# Patient Record
Sex: Female | Born: 1970
Health system: Southern US, Community
[De-identification: ages and names within clinical notes are randomized; demographics above are authoritative.]

## PROBLEM LIST (undated history)

## (undated) DIAGNOSIS — N951 Menopausal and female climacteric states: Secondary | ICD-10-CM

## (undated) DIAGNOSIS — F329 Major depressive disorder, single episode, unspecified: Secondary | ICD-10-CM

## (undated) DIAGNOSIS — Z803 Family history of malignant neoplasm of breast: Secondary | ICD-10-CM

## (undated) DIAGNOSIS — Z1371 Encounter for nonprocreative screening for genetic disease carrier status: Secondary | ICD-10-CM

## (undated) DIAGNOSIS — F32A Depression, unspecified: Secondary | ICD-10-CM

## (undated) DIAGNOSIS — G4700 Insomnia, unspecified: Secondary | ICD-10-CM

## (undated) DIAGNOSIS — N920 Excessive and frequent menstruation with regular cycle: Secondary | ICD-10-CM

## (undated) HISTORY — DX: Insomnia, unspecified: G47.00

## (undated) HISTORY — DX: Excessive and frequent menstruation with regular cycle: N92.0

## (undated) HISTORY — DX: Encounter for nonprocreative screening for genetic disease carrier status: Z13.71

## (undated) HISTORY — PX: DILATION AND CURETTAGE OF UTERUS: SHX78

## (undated) HISTORY — DX: Depression, unspecified: F32.A

## (undated) HISTORY — DX: Menopausal and female climacteric states: N95.1

## (undated) HISTORY — PX: ENDOMETRIAL ABLATION: SHX621

## (undated) HISTORY — DX: Family history of malignant neoplasm of breast: Z80.3

## (undated) HISTORY — PX: TONSILLECTOMY: SUR1361

## (undated) HISTORY — DX: Major depressive disorder, single episode, unspecified: F32.9

---

## 2005-05-14 ENCOUNTER — Emergency Department: Payer: Self-pay | Admitting: Unknown Physician Specialty

## 2007-02-06 ENCOUNTER — Ambulatory Visit: Payer: Self-pay

## 2011-01-04 ENCOUNTER — Ambulatory Visit: Payer: Self-pay

## 2012-12-11 HISTORY — PX: ABLATION ON ENDOMETRIOSIS: SHX5787

## 2013-07-16 ENCOUNTER — Ambulatory Visit: Payer: Self-pay | Admitting: Obstetrics & Gynecology

## 2013-07-16 LAB — CBC
HCT: 34.2 % — ABNORMAL LOW (ref 35.0–47.0)
HGB: 12.3 g/dL (ref 12.0–16.0)
MCH: 30.6 pg (ref 26.0–34.0)
MCV: 85 fL (ref 80–100)
Platelet: 262 10*3/uL (ref 150–440)
RBC: 4 10*6/uL (ref 3.80–5.20)
RDW: 12.3 % (ref 11.5–14.5)
WBC: 6.3 10*3/uL (ref 3.6–11.0)

## 2013-07-17 ENCOUNTER — Ambulatory Visit: Payer: Self-pay | Admitting: Obstetrics & Gynecology

## 2013-10-11 HISTORY — PX: COLONOSCOPY: SHX174

## 2014-11-13 ENCOUNTER — Ambulatory Visit (INDEPENDENT_AMBULATORY_CARE_PROVIDER_SITE_OTHER): Payer: Managed Care, Other (non HMO)

## 2014-11-13 ENCOUNTER — Encounter: Payer: Self-pay | Admitting: Podiatry

## 2014-11-13 ENCOUNTER — Ambulatory Visit (INDEPENDENT_AMBULATORY_CARE_PROVIDER_SITE_OTHER): Payer: Managed Care, Other (non HMO) | Admitting: Podiatry

## 2014-11-13 VITALS — BP 136/86 | HR 74 | Resp 16 | Ht 62.0 in | Wt 132.0 lb

## 2014-11-13 DIAGNOSIS — M722 Plantar fascial fibromatosis: Secondary | ICD-10-CM

## 2014-11-13 MED ORDER — MELOXICAM 15 MG PO TABS: 15.0000 mg | ORAL_TABLET | Freq: Every day | ORAL | Status: DC

## 2014-11-13 MED ORDER — TRIAMCINOLONE ACETONIDE 10 MG/ML IJ SUSP
10.0000 mg | Freq: Once | INTRAMUSCULAR | Status: AC
  Administered 2014-11-13: 10 mg

## 2014-11-13 NOTE — Progress Notes (Signed)
   Subjective:    Patient ID: Lisa Mcbride, female    DOB: 03/06/1971, 43 y.o.   MRN: [id number]  HPI Comments: i have heel pain in my left foot and on the outside (lateral) this has been going on for over 1 year. Its remained the same. Walking and standing hurts. It hurts pretty much all the time. i seen dr troxler and he made me custom inserts and one injection. He did an ultra sound of my foot but no x-rays.  Foot Pain      Review of Systems  All other systems reviewed and are negative.      Objective:   Physical Exam        Assessment & Plan:

## 2014-11-13 NOTE — Patient Instructions (Signed)

## 2014-11-15 NOTE — Progress Notes (Signed)
Subjective:     Patient ID: Lisa Mcbride, female   DOB: Jan 19, 1971, 43 y.o.   MRN: [id number]  HPI patient states that she's had a one year history of heel pain left it's been treated by another physician with no results of the current time and she has difficulty walking and she has difficulty doing any forms of activity   Review of Systems  All other systems reviewed and are negative.      Objective:   Physical Exam  Constitutional: She is oriented to person, place, and time.  Cardiovascular: Intact distal pulses.   Musculoskeletal: Normal range of motion.  Neurological: She is oriented to person, place, and time.  Skin: Skin is warm.  Nursing note and vitals reviewed.  neurovascular status intact with muscle strength adequate and range of motion subtalar and midtarsal joint within normal limits. Patient is noted to have significant discomfort in the plantar left heel at the insertion of the tendon into the calcaneus with inflammation and fluid at the insertional point. Patient walks with a propulsive gait pattern due to pain and does have moderate depression of the arch upon weightbearing     Assessment:     Chronic plantar fasciitis left that has not spotted to conservative care    Plan:     H&P and x-rays reviewed. Today I did inject the plantar heel from the medial side which she has not had done 3 mg Kenalog 5 mg Xylocaine and dispensed air fracture walker with instructions on usage. Patient will be seen back for [place] to reevaluate again in the next several weeks and may require a more aggressive treatment course depending on the response

## 2014-11-20 ENCOUNTER — Ambulatory Visit (INDEPENDENT_AMBULATORY_CARE_PROVIDER_SITE_OTHER): Payer: Managed Care, Other (non HMO) | Admitting: Podiatry

## 2014-11-20 VITALS — BP 134/87 | HR 75 | Resp 16

## 2014-11-20 DIAGNOSIS — M722 Plantar fascial fibromatosis: Secondary | ICD-10-CM

## 2014-11-21 NOTE — Progress Notes (Signed)
Subjective:     Patient ID: Alto [place], female   DOB: September 19, 1971, 43 y.o.   MRN: [id number]  HPI patient states her heel continues to bother her some but it has improved but continues to bother her and she gets up in the morning and after periods of sitting   Review of Systems     Objective:   Physical Exam Neurovascular status intact with pain in the plantar heel left at the insertional point of the tendon into the calcaneus with fluid buildup noted upon evaluation and noted to have depression of the arch and mechanical dysfunction    Assessment:     Plantar fasciitis left still present was structural changes    Plan:     H&P and condition discussed. I went ahead today and recommended long-term night splint to try to stretch the plantar fascia properly with ice therapy and scanned for custom orthotics to try to reduce the mechanical dysfunction. Reappoint when orthotics are returned

## 2014-12-25 ENCOUNTER — Ambulatory Visit (INDEPENDENT_AMBULATORY_CARE_PROVIDER_SITE_OTHER): Payer: Commercial Managed Care - PPO | Admitting: *Deleted

## 2014-12-25 DIAGNOSIS — M722 Plantar fascial fibromatosis: Secondary | ICD-10-CM

## 2014-12-25 NOTE — Progress Notes (Signed)
Orthotics dispensed. Instructions given. Will see in 1 mo.  on progress.

## 2014-12-25 NOTE — Patient Instructions (Signed)

## 2015-01-26 ENCOUNTER — Ambulatory Visit (INDEPENDENT_AMBULATORY_CARE_PROVIDER_SITE_OTHER): Payer: Commercial Managed Care - PPO | Admitting: Podiatry

## 2015-01-26 DIAGNOSIS — M722 Plantar fascial fibromatosis: Secondary | ICD-10-CM

## 2015-01-26 NOTE — Progress Notes (Signed)
Orthotics functioning well. Still having some symptomotology.

## 2015-01-27 NOTE — Progress Notes (Signed)
Subjective:     Patient ID: Lisa Mcbride, female   DOB: 1970-12-13, 44 y.o.   MRN: [id number]  HPI patient states that my orthotics do well but I'm still having quite a bit of heel pain and also developing pain in my lower leg and my upper leg and I think it might be from walking differently   Review of Systems     Objective:   Physical Exam Neurovascular status intact with no changes in health history and continued severe discomfort in the plantar aspect of the left heel at the insertion of the tendon into the calcaneus. Most likely the pain she experiences in her leg is related to this due to obvious gait change upon evaluation    Assessment:     Approximate 1-1/2-2 year history of plantar fasciitis left that is probably causing change in gait and leading to other issues    Plan:     Reviewed this condition with her at great length at this point I have recommended that we need to take a more aggressive approach due to her long-standing nature of condition and pain. I discussed different treatment options and she discussed the possibility for amniotic injection which she can't get in the future. I do not recommend this treatment at this time but I do think shockwave would be her best solution currently I reviewed shockwave with her and she wants to pursue this option and she is scheduled for procedure understanding risk. I went ahead today and I did dispense air fracture walker with instructions on usage and she is scheduled 10 day to initiate shockwave therapy

## 2015-02-05 ENCOUNTER — Ambulatory Visit (INDEPENDENT_AMBULATORY_CARE_PROVIDER_SITE_OTHER): Payer: Commercial Managed Care - PPO | Admitting: Podiatry

## 2015-02-05 DIAGNOSIS — M722 Plantar fascial fibromatosis: Secondary | ICD-10-CM

## 2015-02-05 NOTE — Progress Notes (Signed)
Bar 4.0  freq 16.0    Pulses 2500

## 2015-02-06 NOTE — Progress Notes (Signed)
Subjective:     Patient ID: Lisa Mcbride, female   DOB: 22-Jun-1971, 44 y.o.   MRN: [id number]  HPI patient presents with heel pain left   Review of Systems     Objective:   Physical Exam Neurovascular status intact severe discomfort plantar left heel    Assessment:     Long-term acute plantar fasciitis left    Plan:     Shockwave administered 2500 shocks 16 frequency 4.0 on intensity

## 2015-02-11 ENCOUNTER — Encounter: Payer: Self-pay | Admitting: Podiatry

## 2015-02-11 ENCOUNTER — Ambulatory Visit (INDEPENDENT_AMBULATORY_CARE_PROVIDER_SITE_OTHER): Payer: Commercial Managed Care - PPO | Admitting: Podiatry

## 2015-02-11 VITALS — BP 122/90 | HR 75

## 2015-02-11 DIAGNOSIS — M722 Plantar fascial fibromatosis: Secondary | ICD-10-CM

## 2015-02-11 NOTE — Progress Notes (Signed)
Subjective:     Patient ID: Lisa Mcbride, female   DOB: 1971/08/11, 44 y.o.   MRN: [id number]  HPI patient presents for shockwave left stating that she still is having some pain   Review of Systems     Objective:   Physical Exam Neurovascular status intact with diminishment of pain but present left    Assessment:     Plantar fasciitis still present left    Plan:     Shockwave administered 2500 shocks 16 frequency 4.4 on intensity

## 2015-02-19 ENCOUNTER — Ambulatory Visit (INDEPENDENT_AMBULATORY_CARE_PROVIDER_SITE_OTHER): Payer: Commercial Managed Care - PPO | Admitting: Podiatry

## 2015-02-19 DIAGNOSIS — M722 Plantar fascial fibromatosis: Secondary | ICD-10-CM

## 2015-02-19 NOTE — Progress Notes (Signed)
Subjective:     Patient ID: Lisa Mcbride, female   DOB: 17-Aug-1971, 44 y.o.   MRN: [id number]  HPI patient presents stating she is improving with shockwave therapy   Review of Systems     Objective:   Physical Exam Neurovascular status intact diminished discomfort left plantar heel    Assessment:     Improving plantar fasciitis left    Plan:     2500 shocks administered at 5.0 intensity 16 frequency

## 2015-03-12 DIAGNOSIS — Z1371 Encounter for nonprocreative screening for genetic disease carrier status: Secondary | ICD-10-CM

## 2015-03-12 HISTORY — DX: Encounter for nonprocreative screening for genetic disease carrier status: Z13.71

## 2015-03-19 ENCOUNTER — Encounter: Payer: Self-pay | Admitting: Podiatry

## 2015-03-19 ENCOUNTER — Ambulatory Visit (INDEPENDENT_AMBULATORY_CARE_PROVIDER_SITE_OTHER): Payer: Commercial Managed Care - PPO | Admitting: Podiatry

## 2015-03-19 DIAGNOSIS — M722 Plantar fascial fibromatosis: Secondary | ICD-10-CM

## 2015-03-21 NOTE — Progress Notes (Signed)
Subjective:     Patient ID: Lisa Mcbride, female   DOB: 04-23-1971, 44 y.o.   MRN: [id number]  HPI patient states my heel was doing better but then it started to hurt again when I was excessively active   Review of Systems     Objective:   Physical Exam Neurovascular status intact a number moderate nature plantar heel left but nowhere near as bad as when we started with treatment    Assessment:     Plantar fasciitis improving left but present    Plan:     Shockwave administered 2500 shocks 16 frequency 5.0 intensity and will be seen back in several months if symptoms were to persist

## 2015-04-02 NOTE — Op Note (Signed)
PATIENT NAME:  Lisa Mcbride, KRIKORIAN MR#:  [id number] DATE OF BIRTH:  02-11-1971  DATE OF PROCEDURE:  07/17/2013  PREOPERATIVE DIAGNOSIS:  Menorrhagia.   POSTOPERATIVE DIAGNOSIS:  Menorrhagia.   PROCEDURE PERFORMED:  Hysteroscopy, dilation and curettage procedure with endometrial ablation using NovaSure.   SURGEON:  Barnett Applebaum, M.D.   ANESTHESIA:  General.   ESTIMATED BLOOD LOSS:  Minimal.   COMPLICATIONS:  None.   FINDINGS:  The patient had a proliferative endometrial lining with no evidence for polyps or fibroids.  Measurements for the NovaSure device included a cavity length of 5.5 cm, a cavity width of 3.5 cm, a power of 106 and a time of 70 seconds.   DISPOSITION:  To the recovery room in stable condition.   TECHNIQUE:  The patient is prepped and draped in the usual sterile fashion after adequate anesthesia is obtained in the dorsal lithotomy position.  Bladder is drained using a Robinson catheter.  The cervix is identified and after speculum is placed and the anterior lip is grasped with a tenaculum, the cervix is carefully dilated to a size 16 Pratt dilator.  The hysteroscope was then inserted with saline distention of the intrauterine cavity and the above-mentioned findings are visualized.  Instrument is removed and a gentle curettage is performed using a banjo curette.  Specimen sent to pathology.  Using the NovaSure endometrial ablation device with above-mentioned measurements, it is placed without complication.  It is tested and determined to be safely placed.  The procedure is performed and once complete the instrument is removed.  Repeat hysteroscopy reveals a blanching of the lining of the uterus.  There are no evidence for perforations or bleeding.  Instrument is removed.  There is a minimal discrepancy of saline fluid at the end of the case.  The patient tolerates the procedure well.  All sponge, instrument and needle counts are correct.    ____________________________ R. Barnett Applebaum, MD rph:ea D: 07/17/2013 16:53:06 ET T: 07/18/2013 02:57:15 ET JOB#: [id number]  cc: Glean Salen, MD, <Dictator> Gae Dry MD ELECTRONICALLY SIGNED 07/22/2013 8:20

## 2015-12-08 ENCOUNTER — Ambulatory Visit: Payer: Commercial Managed Care - PPO | Admitting: Podiatry

## 2015-12-14 ENCOUNTER — Ambulatory Visit (INDEPENDENT_AMBULATORY_CARE_PROVIDER_SITE_OTHER): Payer: 59

## 2015-12-14 ENCOUNTER — Encounter: Payer: Self-pay | Admitting: Sports Medicine

## 2015-12-14 ENCOUNTER — Ambulatory Visit (INDEPENDENT_AMBULATORY_CARE_PROVIDER_SITE_OTHER): Payer: 59 | Admitting: Sports Medicine

## 2015-12-14 DIAGNOSIS — M21621 Bunionette of right foot: Secondary | ICD-10-CM

## 2015-12-14 DIAGNOSIS — M79671 Pain in right foot: Secondary | ICD-10-CM

## 2015-12-14 DIAGNOSIS — M79672 Pain in left foot: Secondary | ICD-10-CM | POA: Diagnosis not present

## 2015-12-14 DIAGNOSIS — M722 Plantar fascial fibromatosis: Secondary | ICD-10-CM | POA: Diagnosis not present

## 2015-12-14 MED ORDER — METHYLPREDNISOLONE 4 MG PO TBPK: ORAL_TABLET | ORAL | Status: DC

## 2015-12-14 MED ORDER — DICLOFENAC SODIUM 75 MG PO TBEC: 75.0000 mg | DELAYED_RELEASE_TABLET | Freq: Two times a day (BID) | ORAL | Status: DC

## 2015-12-14 NOTE — Patient Instructions (Signed)

## 2015-12-14 NOTE — Progress Notes (Signed)
Patient ID: Lisa Mcbride, female   DOB: 12/17/1970, 45 y.o.   MRN: [id number] Subjective: Lisa Mcbride is a 45 y.o. female patient presents to office with complaint of heel pain bilateral. Patient admits to post static dyskinesia for 3 months in right and for 2 years in the left. Was treated in the past with 2 injections and shockwave on the left; Patient also went to chiropractor and had active release procedure which seemed to helped but insurance stopped pain for it. Patient states that she has also tried stretching, advil, and change of shoes and has been running 63mi/wk with no difference. Denies any other pedal complaints.  There are no active problems to display for this patient.  Current Outpatient Prescriptions on File Prior to Visit  Medication Sig Dispense Refill  . Calcium-Magnesium-Zinc 333-133-5 MG TABS Take by mouth.    . Cholecalciferol (VITAMIN D3) 1000 UNITS CAPS Take by mouth.     No current facility-administered medications on file prior to visit.   Allergies  Allergen Reactions  . Codeine Nausea And Vomiting    Also nausea.     Objective: Physical Exam General: The patient is alert and oriented x3 in no acute distress.  Dermatology: Skin is warm, dry and supple bilateral lower extremities. Nails 1-10 are normal. There is no erythema, edema, no eccymosis, no open lesions present. Integument is otherwise unremarkable.  Vascular: Dorsalis Pedis pulse and Posterior Tibial pulse are 2/4 bilateral. Capillary fill time is immediate to all digits.  Neurological: Grossly intact to light touch with an achilles reflex of +2/5 and a  negative Tinel's sign bilateral.  Musculoskeletal: Tenderness to palpation at the medial calcaneal tubercale and through the insertion of the plantar fascia bilateral feet. Mild tenderness to right 5th MTPJ with mild edema likely early capsulitis and tailors bunion deformity. No pain with compression of calcaneus bilateral. No pain with  tuning fork to calcaneus bilateral. No pain with calf compression bilateral. There is mild decreased Ankle joint range of motion bilateral. All other joints range of motion within normal limits bilateral. Strength 5/5 in all groups bilateral.    Xray, Right foot: 3 Views Normal osseous mineralization. Joint spaces preserved. Mild tailors bunion deformity. No fracture/dislocation/boney destruction. Osteophtye at TN joint and Calcaneal spur present with mild thickening of plantar fascia. No other soft tissue abnormalities or radiopaque foreign bodies.   Assessment and Plan: Problem List Items Addressed This Visit    None    Visit Diagnoses    Right foot pain    -  Primary    Relevant Medications    diclofenac (VOLTAREN) 75 MG EC tablet    Other Relevant Orders    DG Foot 2 Views Right    Left foot pain        Relevant Medications    diclofenac (VOLTAREN) 75 MG EC tablet    Plantar fasciitis, bilateral        Relevant Medications    methylPREDNISolone (MEDROL DOSEPAK) 4 MG TBPK tablet    diclofenac (VOLTAREN) 75 MG EC tablet    Tailor's bunion of right foot        Relevant Medications    diclofenac (VOLTAREN) 75 MG EC tablet       -Complete examination performed. Discussed with patient in detail the condition of taliors bunion and plantar fasciitis, how this occurs and general treatment options. Explained both conservative and surgical treatments.  -Patient declined injection today and reports that she has a friend that is a  medical rep would like to contact him to donate stim cell product for injection; Discussed with patient that if she would have the rep to call the office for [place] to arrange appropriate process of getting the stim cell product that I would inject both heels for the patient -Rx Diclofenac and Medrol dose pack to start after dose pack is completed -Recommended good supportive shoes and advised use of OTC insert.  - Explained in detail the use of the fascial brace &  night splint. A new fascial brace was dispensed for the right foot at today's visit. Patient to go back to using fascial brace she already owns for the left and night splint daily of which she owns daily alternating as instructed. -Explained and dispensed to patient daily stretching exercises. -Recommend cut back on running to 32mi/wk until symptoms improve. -Recommend patient to ice affected area 1-2x daily. -Patient to return to office in 3 weeks for possible stim cell injection or sooner if problems or questions arise.  Landis Martins, DPM

## 2016-01-05 ENCOUNTER — Other Ambulatory Visit: Payer: Self-pay | Admitting: Sports Medicine

## 2016-01-07 ENCOUNTER — Ambulatory Visit: Payer: Commercial Managed Care - PPO | Admitting: Sports Medicine

## 2016-01-11 ENCOUNTER — Ambulatory Visit (INDEPENDENT_AMBULATORY_CARE_PROVIDER_SITE_OTHER): Payer: 59 | Admitting: Sports Medicine

## 2016-01-11 ENCOUNTER — Encounter: Payer: Self-pay | Admitting: Sports Medicine

## 2016-01-11 DIAGNOSIS — M21621 Bunionette of right foot: Secondary | ICD-10-CM

## 2016-01-11 DIAGNOSIS — M722 Plantar fascial fibromatosis: Secondary | ICD-10-CM

## 2016-01-11 DIAGNOSIS — M79671 Pain in right foot: Secondary | ICD-10-CM

## 2016-01-11 DIAGNOSIS — M79672 Pain in left foot: Secondary | ICD-10-CM

## 2016-01-11 MED ORDER — METHYLPREDNISOLONE 4 MG PO TBPK: ORAL_TABLET | ORAL | Status: DC

## 2016-01-11 NOTE — Progress Notes (Signed)
Patient ID: EIZABETH ZACARIAS, female   DOB: [birth year], 45 y.o.   MRN: [id number]  Subjective: SOVANNA LUQUETTE is a 45 y.o. female patient retuns to office for follow up eval of heel pain bilateral. Patient states that the steroid medication and the anti-inflammatory really helped; the steroid really helped her feet and all her joints feel much better but since completing it the pain is starting to come back in both heels; right is sharp and left is dull achy worse after a period of sitting to standing position. Denies any other pedal complaints.  There are no active problems to display for this patient.  Current Outpatient Prescriptions on File Prior to Visit  Medication Sig Dispense Refill  . Calcium-Magnesium-Zinc 333-133-5 MG TABS Take by mouth.    . Cholecalciferol (VITAMIN D3) 1000 UNITS CAPS Take by mouth.    . citalopram (CELEXA) 20 MG tablet Take by mouth.    . diazepam (VALIUM) 5 MG tablet   0  . diclofenac (VOLTAREN) 75 MG EC tablet Take 1 tablet (75 mg total) by mouth 2 (two) times daily. 30 tablet 0   No current facility-administered medications on file prior to visit.   Allergies  Allergen Reactions  . Codeine Nausea And Vomiting    Also nausea.     Objective: Physical Exam General: The patient is alert and oriented x3 in no acute distress.  Dermatology: Skin is warm, dry and supple bilateral lower extremities. Nails 1-10 are normal. There is no erythema, edema, no eccymosis, no open lesions present. Integument is otherwise unremarkable.  Vascular: Dorsalis Pedis pulse and Posterior Tibial pulse are 2/4 bilateral. Capillary fill time is immediate to all digits.  Neurological: Grossly intact to light touch with an achilles reflex of +2/5 and a  negative Tinel's sign bilateral.  Musculoskeletal: Tenderness to palpation at the medial calcaneal tubercale and through the insertion of the plantar fascia bilateral feet, R>L on today's exam. No tenderness to right 5th MTPJ  with decreased edema likely early capsulitis and tailors bunion deformity. No pain with compression of calcaneus bilateral. No pain with tuning fork to calcaneus bilateral. No pain with calf compression bilateral. There is mild decreased Ankle joint range of motion bilateral. All other joints range of motion within normal limits bilateral. Strength 5/5 in all groups bilateral.    Assessment and Plan: Problem List Items Addressed This Visit    None    Visit Diagnoses    Plantar fasciitis, bilateral    -  Primary    Relevant Medications    methylPREDNISolone (MEDROL DOSEPAK) 4 MG TBPK tablet    Tailor's bunion of right foot        Right foot pain        Left foot pain           -Complete examination performed. Discussed with patient in detail the condition of taliors bunion and plantar fasciitis, how this occurs and general treatment options. Explained both conservative and surgical treatments.  -Awaiting medical rep to contact me to donate stim cell product for injection; Discussed with patient that if she would have the rep to call the office for [place] to arrange appropriate process of getting the stim cell product that I would inject both heels for the patient -Rx refilled Medrol dose pack for the meantime to help with symptoms -Recommended good supportive shoes and advised use of OTC insert.  - Cont with fascial brace & night splint daily. -Cont daily stretching exercises. -Recommend cont with  cutting back on running to 43mi/wk until symptoms improve. -Recommend patient to ice affected area 1-2x daily. -Patient to return to office in 2 weeks for possible stim cell injection or sooner if problems or questions arise.  Landis Martins, DPM

## 2016-01-28 ENCOUNTER — Ambulatory Visit: Payer: 59 | Admitting: Sports Medicine

## 2016-02-04 ENCOUNTER — Ambulatory Visit: Payer: 59 | Admitting: Sports Medicine

## 2016-02-24 ENCOUNTER — Telehealth: Payer: Self-pay | Admitting: *Deleted

## 2016-02-24 NOTE — Telephone Encounter (Addendum)
-----   Message from Landis Martins, [place] sent at 02/23/2016  7:47 PM EDT ----- Regarding: Flo-Graft Injection  Hi Roran Wegner,  Can you call Patient to see if she is still having fasciitis pain and symptoms?  If so can you let her know I finally talked with the medical rep from [facility] for the injection to try on her feet and get her scheduled for an appointment. Once her appointment has been set, we will need to contact Richardson Landry from [facility] to come the day of her appointment with the Flo-graft amniotic injectable.  Thanks Dr. Cannon Kettle  02/23/2106- Unable to contact pt to check plantar fasciitis status, contact phone rang for over 1 minute without answering service.  02/28/2016-Informed Dr. Cannon Kettle I had not been able to contact pt and she said she would also try to contact pt.

## 2016-03-01 ENCOUNTER — Telehealth: Payer: Self-pay | Admitting: Sports Medicine

## 2016-03-01 NOTE — Telephone Encounter (Signed)
Left voice message with call back phone # in reference to patient coming to office for injection of Flo-graft amniotic. Patient did not answered. Advised patient over the voicemail that if she is still having fasciitis symptoms to call the office to schedule an appointment to come in for the injection, The injection is to be supplied compliments of [facility].  -Dr. Cannon Kettle

## 2016-03-02 ENCOUNTER — Ambulatory Visit (INDEPENDENT_AMBULATORY_CARE_PROVIDER_SITE_OTHER): Payer: 59 | Admitting: Sports Medicine

## 2016-03-02 ENCOUNTER — Encounter: Payer: Self-pay | Admitting: Sports Medicine

## 2016-03-02 DIAGNOSIS — M21621 Bunionette of right foot: Secondary | ICD-10-CM | POA: Diagnosis not present

## 2016-03-02 DIAGNOSIS — M79672 Pain in left foot: Secondary | ICD-10-CM

## 2016-03-02 DIAGNOSIS — M79671 Pain in right foot: Secondary | ICD-10-CM | POA: Diagnosis not present

## 2016-03-02 DIAGNOSIS — M722 Plantar fascial fibromatosis: Secondary | ICD-10-CM

## 2016-03-02 NOTE — Progress Notes (Signed)
Patient ID: Lisa Mcbride, female   DOB: 08-01-1971, 45 y.o.   MRN: [id number] Subjective: Lisa Mcbride is a 45 y.o. female patient retuns to office for follow up eval of heel pain bilateral. Patient still has right greater than left heel pain that is dull achy worse after a period of sitting to standing position. Patient is desiring special amniotic injection for plantar fasciitis as supplied by [employer] complimentary to patient. Admits to occasional pain over the right fifth metatarsophalangeal joint tailors bunion area from compensation from chronic pain in heels. Denies any other pedal complaints.  There are no active problems to display for this patient.  Current Outpatient Prescriptions on File Prior to Visit  Medication Sig Dispense Refill  . Calcium-Magnesium-Zinc 333-133-5 MG TABS Take by mouth.    . Cholecalciferol (VITAMIN D3) 1000 UNITS CAPS Take by mouth.    . citalopram (CELEXA) 20 MG tablet Take by mouth.    . diazepam (VALIUM) 5 MG tablet   0  . diclofenac (VOLTAREN) 75 MG EC tablet Take 1 tablet (75 mg total) by mouth 2 (two) times daily. 30 tablet 0  . methylPREDNISolone (MEDROL DOSEPAK) 4 MG TBPK tablet Take as instructed 21 tablet 0   No current facility-administered medications on file prior to visit.   Allergies  Allergen Reactions  . Codeine Nausea And Vomiting    Also nausea.     Objective: Physical Exam General: The patient is alert and oriented x3 in no acute distress.  Dermatology: Skin is warm, dry and supple bilateral lower extremities. Nails 1-10 are normal. There is no erythema, edema, no eccymosis, no open lesions present. Integument is otherwise unremarkable.  Vascular: Dorsalis Pedis pulse and Posterior Tibial pulse are 2/4 bilateral. Capillary fill time is immediate to all digits.  Neurological: Grossly intact to light touch with an achilles reflex of +2/5 and a  negative Tinel's sign bilateral.  Musculoskeletal: Tenderness to  palpation at the medial calcaneal tubercale and through the insertion of the plantar fascia bilateral feet, R>L. No tenderness to right 5th MTPJ with decreased edema likely early capsulitis and tailors bunion deformity, secondary to overload compensation due to heel pain. No pain with tuning fork to calcaneus bilateral. No pain with calf compression bilateral. There is mild decreased Ankle joint range of motion bilateral. All other joints range of motion within normal limits bilateral. Strength 5/5 in all groups bilateral.    Assessment and Plan: Problem List Items Addressed This Visit    None    Visit Diagnoses    Plantar fasciitis, bilateral    -  Primary    Tailor's bunion of right foot        Right foot pain        Left foot pain          -Complete examination performed. Discussed with patient in detail the condition of taliors bunion and plantar fasciitis, how this occurs and general treatment options. Explained both conservative and surgical treatments.  -After aseptic prep, injected to right and left heels at medial insertion of plantar fascia 1 mL mixture of lidocaine mixed with 1 mL [employer] flo-graft freedom extra-large, product code number [id number], identifer number [id number], expiration date 10/08/2019. None wasted. This injectable Biologics was supplied by the company at no charge. Patient tolerated injection well with no acute complication. Applied offloading arch pad and plantar fascial compression wraps bilateral to protect fascia after injection; patient to keep intact for 1 day. Advised patient to refrain  from running or extensive activities for 1 week. Patient to refrain from using anti-inflammatories NSAIDs or ice. At this time to allow the stem cell properties of this injectable to naturally work. -Recommended good supportive shoes and advised use of OTC insert -Cont with fascial brace & night splint daily -Cont daily stretching exercises -Patient to return to  office in 3 weeks for recheck of injection sites or sooner if problems or questions arise.  Landis Martins, DPM

## 2016-03-03 ENCOUNTER — Ambulatory Visit: Payer: 59 | Admitting: Sports Medicine

## 2016-03-31 ENCOUNTER — Ambulatory Visit: Payer: 59 | Admitting: Sports Medicine

## 2016-04-21 ENCOUNTER — Ambulatory Visit: Payer: 59 | Admitting: Sports Medicine

## 2016-05-12 ENCOUNTER — Encounter: Payer: Self-pay | Admitting: Sports Medicine

## 2016-05-12 ENCOUNTER — Ambulatory Visit (INDEPENDENT_AMBULATORY_CARE_PROVIDER_SITE_OTHER): Payer: 59 | Admitting: Sports Medicine

## 2016-05-12 DIAGNOSIS — M722 Plantar fascial fibromatosis: Secondary | ICD-10-CM

## 2016-05-12 DIAGNOSIS — M79672 Pain in left foot: Secondary | ICD-10-CM

## 2016-05-12 DIAGNOSIS — M79671 Pain in right foot: Secondary | ICD-10-CM

## 2016-05-12 NOTE — Progress Notes (Signed)
Patient ID: Lisa Mcbride, female   DOB: 06/02/1971, 44 y.o.   MRN: [id number] Subjective: Lisa Mcbride is a 45 y.o. female patient retuns to office for follow up eval of heel pain bilateral, S/p Flograft amniotic injection on 03-02-16. Patient states it helped for 2 days and she still has right greater than left heel pain that is dull achy worse after a period of sitting to standing position. Admits to resolved pain over the right fifth metatarsophalangeal joint tailors bunion area. Denies any other pedal complaints.  There are no active problems to display for this patient.  Current Outpatient Prescriptions on File Prior to Visit  Medication Sig Dispense Refill  . Calcium-Magnesium-Zinc 333-133-5 MG TABS Take by mouth.    . Cholecalciferol (VITAMIN D3) 1000 UNITS CAPS Take by mouth.    . citalopram (CELEXA) 20 MG tablet Take by mouth.    . diazepam (VALIUM) 5 MG tablet   0  . diclofenac (VOLTAREN) 75 MG EC tablet Take 1 tablet (75 mg total) by mouth 2 (two) times daily. 30 tablet 0  . methylPREDNISolone (MEDROL DOSEPAK) 4 MG TBPK tablet Take as instructed 21 tablet 0   No current facility-administered medications on file prior to visit.   Allergies  Allergen Reactions  . Codeine Nausea And Vomiting    Also nausea.     Objective: Physical Exam General: The patient is alert and oriented x3 in no acute distress.  Dermatology: Skin is warm, dry and supple bilateral lower extremities. Nails 1-10 are normal. There is no erythema, edema, no eccymosis, no open lesions present. Integument is otherwise unremarkable.  Vascular: Dorsalis Pedis pulse and Posterior Tibial pulse are 2/4 bilateral. Capillary fill time is immediate to all digits.  Neurological: Grossly intact to light touch with an achilles reflex of +2/5 and a  negative Tinel's sign bilateral.  Musculoskeletal: Tenderness to palpation at the medial calcaneal tubercale and through the insertion of the plantar fascia  bilateral feet, R>L. No tenderness to right 5th MTPJ/tailors bunion deformity, secondary to overload compensation due to heel pain. No pain with tuning fork to calcaneus bilateral. No pain with calf compression bilateral. There is mild decreased Ankle joint range of motion bilateral. All other joints range of motion within normal limits bilateral. Strength 5/5 in all groups bilateral.    Assessment and Plan: Problem List Items Addressed This Visit    None    Visit Diagnoses    Plantar fasciitis, bilateral    -  Primary    Relevant Orders    CBC with Differential (Completed)    Basic Metabolic Panel (Completed)    C-reactive protein (Completed)    Sedimentation Rate (Completed)    ANA, IFA Comprehensive Panel    HLA-B27 Antigen (Completed)    Uric Acid (Completed)    Rheumatoid factor (Completed)    Right foot pain        Relevant Orders    CBC with Differential (Completed)    Basic Metabolic Panel (Completed)    C-reactive protein (Completed)    Sedimentation Rate (Completed)    ANA, IFA Comprehensive Panel    HLA-B27 Antigen (Completed)    Uric Acid (Completed)    Rheumatoid factor (Completed)    Left foot pain        Relevant Orders    CBC with Differential (Completed)    Basic Metabolic Panel (Completed)    C-reactive protein (Completed)    Sedimentation Rate (Completed)    ANA, IFA Comprehensive Panel  HLA-B27 Antigen (Completed)    Uric Acid (Completed)    Rheumatoid factor (Completed)      -Complete examination performed. Discussed with patient in detail the condition of plantar fasciitis, how this occurs and general treatment options. Explained both conservative and surgical treatments.  -Due to chronicity of symptoms and patient having exhausted all conservative care execept PT recommend arthritics to r/o underlying inflammatory condition  -Recommended good supportive shoes and advised use of custom insert -Cont with fascial brace & night splint daily -Cont daily  stretching exercises -Patient to return to office after labwork or sooner if problems or questions arise.  Landis Martins, DPM

## 2016-05-18 ENCOUNTER — Telehealth: Payer: Self-pay | Admitting: *Deleted

## 2016-05-18 DIAGNOSIS — M722 Plantar fascial fibromatosis: Secondary | ICD-10-CM

## 2016-05-18 NOTE — Telephone Encounter (Addendum)
-----  Message from Landis Martins, [place] sent at 05/18/2016  8:01 AM EDT ----- Regarding: Lab Results  Can you call patient to let her know that I will give her a call once all her blood work results are back, we are still awaiting her HLA B27 result Thanks Dr. Cannon Kettle. Informed pt of Dr. Leeanne Rio statement.

## 2016-05-19 LAB — CBC WITH DIFFERENTIAL/PLATELET
BASOS: 1 %
Basophils Absolute: 0 10*3/uL (ref 0.0–0.2)
EOS (ABSOLUTE): 0.2 10*3/uL (ref 0.0–0.4)
EOS: 3 %
HEMATOCRIT: 39.1 % (ref 34.0–46.6)
HEMOGLOBIN: 13.2 g/dL (ref 11.1–15.9)
IMMATURE GRANULOCYTES: 0 %
Immature Grans (Abs): 0 10*3/uL (ref 0.0–0.1)
Lymphocytes Absolute: 1.7 10*3/uL (ref 0.7–3.1)
Lymphs: 35 %
MCH: 29.5 pg (ref 26.6–33.0)
MCHC: 33.8 g/dL (ref 31.5–35.7)
MCV: 88 fL (ref 79–97)
MONOCYTES: 8 %
Monocytes Absolute: 0.4 10*3/uL (ref 0.1–0.9)
NEUTROS PCT: 53 %
Neutrophils Absolute: 2.6 10*3/uL (ref 1.4–7.0)
Platelets: 235 10*3/uL (ref 150–379)
RBC: 4.47 x10E6/uL (ref 3.77–5.28)
RDW: 13.7 % (ref 12.3–15.4)
WBC: 4.8 10*3/uL (ref 3.4–10.8)

## 2016-05-19 LAB — BASIC METABOLIC PANEL
BUN/Creatinine Ratio: 26 — ABNORMAL HIGH (ref 9–23)
BUN: 21 mg/dL (ref 6–24)
CALCIUM: 10.1 mg/dL (ref 8.7–10.2)
CO2: 24 mmol/L (ref 18–29)
CREATININE: 0.81 mg/dL (ref 0.57–1.00)
Chloride: 99 mmol/L (ref 96–106)
GFR calc Af Amer: 101 mL/min/{1.73_m2} (ref >59)
GFR, EST NON AFRICAN AMERICAN: 87 mL/min/{1.73_m2} (ref >59)
Glucose: 81 mg/dL (ref 65–99)
POTASSIUM: 5.1 mmol/L (ref 3.5–5.2)
Sodium: 140 mmol/L (ref 134–144)

## 2016-05-19 LAB — RHEUMATOID FACTOR: Rhuematoid fact SerPl-aCnc: <10 IU/mL (ref 0.0–13.9)

## 2016-05-19 LAB — C-REACTIVE PROTEIN: CRP: 1 mg/L (ref 0.0–4.9)

## 2016-05-19 LAB — SEDIMENTATION RATE: Sed Rate: 10 mm/hr (ref 0–32)

## 2016-05-19 LAB — HLA-B27 ANTIGEN: HLA B27: POSITIVE

## 2016-05-19 LAB — URIC ACID: URIC ACID: 3.7 mg/dL (ref 2.5–7.1)

## 2016-05-22 NOTE — Telephone Encounter (Addendum)
-----  Message from Landis Martins, [place] sent at 05/19/2016  9:44 AM EDT ----- Regarding: HLA B27 Positive Can you give the patient a call back to let her know that her HLA B27 test came back positive which could possibly be related to her chronic inflammation at her plantar fascia that has not gotten better over the years. This test can be positive in conditions like reactive arthritis, spondylitis, psoriatic arthritis or many others. Recommend that she see a rheumatologist. We can submit consult to Dr. Estanislado Pandy, if patient is agreeable.  Thanks Dr. Cannon Kettle. 05/22/2016-Informed pt of Dr. Leeanne Rio result review and recommendations.  Pt agreed to referral to Dr. Arlean Hopping. Faxed referral chart notes, pt clinicals and demographics to [facility].

## 2016-05-29 ENCOUNTER — Telehealth: Payer: Self-pay | Admitting: Sports Medicine

## 2016-05-29 ENCOUNTER — Telehealth: Payer: Self-pay | Admitting: *Deleted

## 2016-05-29 NOTE — Telephone Encounter (Signed)
Faxed pt's 05/12/2016 lab per her request.

## 2016-05-29 NOTE — Telephone Encounter (Signed)
I informed pt Dr. Estanislado Pandy reviewed the clinicals of referral pts and would call 7-10 days later to schedule. I gave pt the appt line number.

## 2016-05-29 NOTE — Telephone Encounter (Signed)
Patient called said that Dr. Cannon Kettle called her last week about referring her to another doctor and she has not heard anything yet about an appointment. Asked that a nurse call her back.

## 2016-06-02 ENCOUNTER — Telehealth: Payer: Self-pay | Admitting: *Deleted

## 2016-06-02 NOTE — Telephone Encounter (Addendum)
Pt states called Dr. Charlette Caffey office to schedule and the receptionist states the referral was never received.  I told pt I had confirmation the referral was received in Dr. Charlette Caffey office, and I would fax with the 2nd faxing of referral to Dr. Charlette Caffey office. Faxed to Dr. Bronson Curb 2nd time [phone]. 06/06/2016-Pt called states Dr. Arlean Hopping office is having trouble with the main fax machine, and has to alternate fax machine numbers [phone], [phone].  I attempted to fax to referral to both new fax numbers without confirmation. I spoke with Myer Haff - Dr. Estanislado Pandy and told her I had received confirmation for referral being received, but the pt was told the referral had not been sent.  Myer Haff states when the referral clinicals ARE received they are made into a chart for Dr. Estanislado Pandy to accept as a pt or decline.  Myer Haff, suggest mailing the referral to [facility], [address], [place], [place] [address] Attention:  [place] records and state referral clinical and to have Myer Haff contact pt to advise the referral had been received and sent for review. 06/07/2016-Mailed referrals and clinicals to:  [facility], [address] [place], [place] [address], ATTN:  [place], with note requesting Tammy contact pt that the referral had been received and given to Dr. Estanislado Pandy for review and a call to me to confirm receipt. 06/26/2016-Pt states she is taking a trip to [place], Thursday through Sunday, she will be walking a great deal and would like the antiinflammatory pain medication Dr. Cannon Kettle prescribed. 06/27/2016-Informed pt Diclofenac had been called in.

## 2016-06-26 NOTE — Telephone Encounter (Signed)
Please Rx Diclofenac 75mg  bid 30 tabs Thanks Dr. Cannon Kettle

## 2016-06-27 MED ORDER — DICLOFENAC SODIUM 75 MG PO TBEC: 75.0000 mg | DELAYED_RELEASE_TABLET | Freq: Two times a day (BID) | ORAL | Status: DC

## 2016-07-18 ENCOUNTER — Other Ambulatory Visit (HOSPITAL_COMMUNITY): Payer: Self-pay | Admitting: Rheumatology

## 2016-07-18 DIAGNOSIS — G8929 Other chronic pain: Secondary | ICD-10-CM

## 2016-07-18 DIAGNOSIS — Z1589 Genetic susceptibility to other disease: Secondary | ICD-10-CM

## 2016-07-18 DIAGNOSIS — R100 Acute abdomen: Secondary | ICD-10-CM

## 2016-07-18 DIAGNOSIS — M533 Sacrococcygeal disorders, not elsewhere classified: Secondary | ICD-10-CM

## 2016-07-18 DIAGNOSIS — R937 Abnormal findings on diagnostic imaging of other parts of musculoskeletal system: Secondary | ICD-10-CM

## 2016-07-21 ENCOUNTER — Ambulatory Visit (HOSPITAL_COMMUNITY)
Admission: RE | Admit: 2016-07-21 | Discharge: 2016-07-21 | Disposition: A | Discharge status: Home / Self Care | Payer: 59 | Source: Ambulatory Visit | Attending: Rheumatology | Admitting: Rheumatology

## 2016-07-21 DIAGNOSIS — M533 Sacrococcygeal disorders, not elsewhere classified: Secondary | ICD-10-CM

## 2016-07-21 DIAGNOSIS — M5418 Radiculopathy, sacral and sacrococcygeal region: Secondary | ICD-10-CM | POA: Diagnosis not present

## 2016-07-21 DIAGNOSIS — R100 Acute abdomen: Secondary | ICD-10-CM

## 2016-07-21 DIAGNOSIS — Z1589 Genetic susceptibility to other disease: Secondary | ICD-10-CM

## 2016-07-21 DIAGNOSIS — R937 Abnormal findings on diagnostic imaging of other parts of musculoskeletal system: Secondary | ICD-10-CM

## 2016-07-21 DIAGNOSIS — M545 Low back pain: Secondary | ICD-10-CM | POA: Diagnosis present

## 2016-07-21 DIAGNOSIS — G8929 Other chronic pain: Secondary | ICD-10-CM

## 2016-08-08 ENCOUNTER — Telehealth: Payer: Self-pay | Admitting: *Deleted

## 2016-08-08 DIAGNOSIS — M7661 Achilles tendinitis, right leg: Secondary | ICD-10-CM

## 2016-08-08 NOTE — Telephone Encounter (Signed)
Recommend PT at [facility]

## 2016-08-08 NOTE — Telephone Encounter (Addendum)
Pt states she has been evaluated by the Rheumatologist and had a follow up appt, they did refer to PT, but Dr. Cannon Kettle had wanted to do PT with [facility] in [employer] with some dry needling. 08/09/2016-DrCannon Kettle ordered pt to [facility] PT evaluated and treat focusing on ROM and decrease pain. Left message informing pt Dr. Cannon Kettle referred to [facility] on [facility] in [place].

## 2016-09-29 ENCOUNTER — Ambulatory Visit (INDEPENDENT_AMBULATORY_CARE_PROVIDER_SITE_OTHER): Payer: 59 | Admitting: Sports Medicine

## 2016-09-29 ENCOUNTER — Encounter: Payer: Self-pay | Admitting: Sports Medicine

## 2016-09-29 DIAGNOSIS — M7661 Achilles tendinitis, right leg: Secondary | ICD-10-CM | POA: Diagnosis not present

## 2016-09-29 DIAGNOSIS — M7662 Achilles tendinitis, left leg: Secondary | ICD-10-CM | POA: Diagnosis not present

## 2016-09-29 DIAGNOSIS — M79671 Pain in right foot: Secondary | ICD-10-CM | POA: Diagnosis not present

## 2016-09-29 DIAGNOSIS — M79672 Pain in left foot: Secondary | ICD-10-CM

## 2016-09-29 DIAGNOSIS — M722 Plantar fascial fibromatosis: Secondary | ICD-10-CM | POA: Diagnosis not present

## 2016-09-29 NOTE — Progress Notes (Signed)
Patient ID: Lisa Mcbride, female   DOB: [birth year], 45 y.o.   MRN: [id number] Subjective: Lisa Mcbride is a 45 y.o. female patient retuns to office for follow up eval of heel pain bilateral, reports that she tried a month of PT with pain still present. Left sharp and right dull. No improvements with any treatments. Denies any other pedal complaints.  There are no active problems to display for this patient.  Current Outpatient Prescriptions on File Prior to Visit  Medication Sig Dispense Refill  . Calcium-Magnesium-Zinc 333-133-5 MG TABS Take by mouth.    . Cholecalciferol (VITAMIN D3) 1000 UNITS CAPS Take by mouth.    . citalopram (CELEXA) 20 MG tablet Take by mouth.    . diazepam (VALIUM) 5 MG tablet   0  . diclofenac (VOLTAREN) 75 MG EC tablet Take 1 tablet (75 mg total) by mouth 2 (two) times daily. 30 tablet 0  . diclofenac (VOLTAREN) 75 MG EC tablet Take 1 tablet (75 mg total) by mouth 2 (two) times daily. 30 tablet 0  . methylPREDNISolone (MEDROL DOSEPAK) 4 MG TBPK tablet Take as instructed 21 tablet 0   No current facility-administered medications on file prior to visit.    Allergies  Allergen Reactions  . Codeine Nausea And Vomiting    Also nausea.     Objective: Physical Exam General: The patient is alert and oriented x3 in no acute distress.  Dermatology: Skin is warm, dry and supple bilateral lower extremities. Nails 1-10 are normal. There is no erythema, edema, no eccymosis, no open lesions present. Integument is otherwise unremarkable.  Vascular: Dorsalis Pedis pulse and Posterior Tibial pulse are 2/4 bilateral. Capillary fill time is immediate to all digits.  Neurological: Grossly intact to light touch with an achilles reflex of +2/5 and a  negative Tinel's sign bilateral.  Musculoskeletal: Tenderness to palpation at the medial calcaneal tubercale and through the insertion of the plantar fascia and achilles bilateral feet, L>R. No tenderness to right 5th  MTPJ/tailors bunion deformity. No pain with tuning fork to calcaneus bilateral. No pain with calf compression bilateral. There is mild decreased Ankle joint range of motion bilateral. All other joints range of motion within normal limits bilateral. Strength 5/5 in all groups bilateral.    Assessment and Plan: Problem List Items Addressed This Visit    None    Visit Diagnoses    Plantar fasciitis, bilateral    -  Primary   Achilles tendonitis, bilateral       Right foot pain       Left foot pain         -Complete examination performed. Discussed with patient in detail the condition of plantar fasciitis/tendonitis, how this occurs and general treatment options. Explained both conservative and surgical treatments.  -Rheumatology testing negative and MRI by Dr. Dora Sims negative -Due to chronicity of symptoms and patient having exhausted all conservative care except repeat EPAT or EPF with gastrco recession -Patient opt to try EPAT again for a period of 3 weeks thenafter we will determine if this fails surgery. EPAT will be complimentary since patient had this 1 year ago with no improvement; NO CHARGE FOR SHOCKWAVE -Recommended good supportive shoes and advised use of custom insert -Cont with fascial brace & night splint daily -Cont daily stretching exercises -Patient to return to office for shockwave or sooner if problems or questions arise.  Landis Martins, DPM

## 2016-10-03 ENCOUNTER — Ambulatory Visit (INDEPENDENT_AMBULATORY_CARE_PROVIDER_SITE_OTHER): Payer: 59

## 2016-10-03 DIAGNOSIS — M722 Plantar fascial fibromatosis: Secondary | ICD-10-CM

## 2016-10-03 DIAGNOSIS — M7661 Achilles tendinitis, right leg: Secondary | ICD-10-CM

## 2016-10-03 DIAGNOSIS — M7662 Achilles tendinitis, left leg: Secondary | ICD-10-CM

## 2016-10-03 NOTE — Progress Notes (Signed)
   Subjective:    Patient ID: Lisa Mcbride, female    DOB: 23-Sep-1971, 45 y.o.   MRN: [id number]  HPI Pt presents with bilateral heel pain that has been an ongoing problem for her for several months   Review of Systems All other systems negative    Objective:   Physical Exam Pain on palpation of bilateral medial heel band left over right 5 of 10. Soreness on palpation of bilateral achilles tendon Lt over Rt with focus on insertional site       Assessment & Plan:  ESWT administered to Rt plantar fascial band for 14 joules, pain 5 of 10 for 2500 pulses. ESWT to Lt plantar fascial band for 12 joules pain 7 of 10 for 3000 pulses. EPAT delivered to entire plantar fascial band bilateral and to achilles tendon bilateral for 1500 pulses each site. Tolerated well, Lt foot noted significant pain at insertion site achilles tendon during EPAT treatment

## 2016-10-17 ENCOUNTER — Ambulatory Visit (INDEPENDENT_AMBULATORY_CARE_PROVIDER_SITE_OTHER): Payer: 59

## 2016-10-17 DIAGNOSIS — M722 Plantar fascial fibromatosis: Secondary | ICD-10-CM

## 2016-10-17 NOTE — Progress Notes (Signed)
   Subjective:    Patient ID: Lisa Mcbride, female    DOB: Jun 03, 1971, 45 y.o.   MRN: [id number]  HPI Pt presents with bilateral heel pain that has been an ongoing problem for her for several months. Today she did c/o pain on the anterior part of both ankles, but pain has since resolved to those areas   Review of Systems All other systems negative    Objective:   Physical Exam Pain on palpation of bilateral medial heel band left over right 5 of 10. No pain at achilles insertional site bilateral when palpated       Assessment & Plan:  ESWT administered to Rt plantar fascial band for 16 joules, pain 5 of 10 for 2500 pulses. ESWT to Lt plantar fascial band for 14 joules pain 7 of 10 for 3000 pulses. EPAT delivered to entire plantar fascial band bilateral for 1500 pulses each site. Tolerated well. Advised her against high impact exercises, NSAIDS and ice during therapy, also advised on boot usage

## 2016-10-24 ENCOUNTER — Ambulatory Visit (INDEPENDENT_AMBULATORY_CARE_PROVIDER_SITE_OTHER): Payer: 59

## 2016-10-24 DIAGNOSIS — M7661 Achilles tendinitis, right leg: Secondary | ICD-10-CM

## 2016-10-24 DIAGNOSIS — M722 Plantar fascial fibromatosis: Secondary | ICD-10-CM

## 2016-10-24 DIAGNOSIS — M7662 Achilles tendinitis, left leg: Secondary | ICD-10-CM

## 2016-10-24 NOTE — Progress Notes (Signed)
   Subjective:    Patient ID: Lisa Mcbride, female    DOB: 12/02/1971, 45 y.o.   MRN: [id number]  HPI Pt presents with bilateral heel pain that has been an ongoing problem for her for several months. She states that since the last treatment she notice 2-3 days when the pain had improved but then it would return   Review of Systems All other systems negative    Objective:   Physical Exam Pain on palpation of bilateral medial heel band left over right 5 of 10. Mild pain at achilles insertional site bilateral when palpated       Assessment & Plan:  ESWT administered to Rt plantar fascial band for 18 joules, pain 5 of 10 for 2500 pulses. ESWT to Lt plantar fascial band for 20 joules pain 7 of 10 for 3000 pulses. EPAT delivered to entire plantar fascial band bilateral for 1500 pulses each site. Tolerated well. Advised her against high impact exercises, NSAIDS and ice during therapy, also advised on boot usage. She is to follow up in 1 month to be re-evaluated by Dr Cannon Kettle in regards to surgery, is she needs surgery she wants it done before the end of the year

## 2016-11-21 ENCOUNTER — Ambulatory Visit (INDEPENDENT_AMBULATORY_CARE_PROVIDER_SITE_OTHER): Payer: 59 | Admitting: Sports Medicine

## 2016-11-21 DIAGNOSIS — M79672 Pain in left foot: Secondary | ICD-10-CM

## 2016-11-21 DIAGNOSIS — M722 Plantar fascial fibromatosis: Secondary | ICD-10-CM

## 2016-11-21 DIAGNOSIS — M79671 Pain in right foot: Secondary | ICD-10-CM | POA: Diagnosis not present

## 2016-11-21 DIAGNOSIS — M7661 Achilles tendinitis, right leg: Secondary | ICD-10-CM

## 2016-11-21 DIAGNOSIS — M7662 Achilles tendinitis, left leg: Secondary | ICD-10-CM | POA: Diagnosis not present

## 2016-11-21 NOTE — Progress Notes (Signed)
  Patient ID: Lisa Mcbride, female   DOB: 1971-03-03, 45 y.o.   MRN: [id number] Subjective: Lisa Mcbride is a 45 y.o. female patient retuns to office for follow up eval of heel pain bilateral after EPAT treatment #3, performed 1 month ago, reports that pain is improved some, not as painful and that pain is different and feels like more cramping and pressure with prolonged standing. Denies any other pedal complaints.  There are no active problems to display for this patient.  Current Outpatient Prescriptions on File Prior to Visit  Medication Sig Dispense Refill  . Calcium-Magnesium-Zinc 333-133-5 MG TABS Take by mouth.    . Cholecalciferol (VITAMIN D3) 1000 UNITS CAPS Take by mouth.    . citalopram (CELEXA) 20 MG tablet Take by mouth.    . diazepam (VALIUM) 5 MG tablet   0  . diclofenac (VOLTAREN) 75 MG EC tablet Take 1 tablet (75 mg total) by mouth 2 (two) times daily. 30 tablet 0  . diclofenac (VOLTAREN) 75 MG EC tablet Take 1 tablet (75 mg total) by mouth 2 (two) times daily. 30 tablet 0  . methylPREDNISolone (MEDROL DOSEPAK) 4 MG TBPK tablet Take as instructed 21 tablet 0   No current facility-administered medications on file prior to visit.    Allergies  Allergen Reactions  . Codeine Nausea And Vomiting    Also nausea.     Objective: Physical Exam General: The patient is alert and oriented x3 in no acute distress.  Dermatology: Skin is warm, dry and supple bilateral lower extremities. Nails 1-10 are normal. There is no erythema, edema, no eccymosis, no open lesions present. Integument is otherwise unremarkable.  Vascular: Dorsalis Pedis pulse and Posterior Tibial pulse are 2/4 bilateral. Capillary fill time is immediate to all digits.  Neurological: Grossly intact to light touch with an achilles reflex of +2/5 and a  negative Tinel's sign bilateral.  Musculoskeletal: Decreased tenderness to palpation at the medial calcaneal tubercale and through the insertion of the  plantar fascia and achilles bilateral feet, L>R. No tenderness to right 5th MTPJ/tailors bunion deformity. No pain with tuning fork to calcaneus bilateral. No pain with calf compression bilateral. There is mild decreased Ankle joint range of motion bilateral. All other joints range of motion within normal limits bilateral. Strength 5/5 in all groups bilateral.    Assessment and Plan: Problem List Items Addressed This Visit    None    Visit Diagnoses    Plantar fasciitis, bilateral    -  Primary   Achilles tendonitis, bilateral       Right foot pain       Left foot pain         -Complete examination performed. Discussed with patient in detail the condition of plantar fasciitis/tendonitis, how this occurs and general treatment options. Explained both conservative and surgical treatments.  -Recommend NO SURGERY AT THIS TIME because she is making some improving. Advised patient to give it more time before we consider EPF with gastrco recession.  -Recommend another EPAT treatment. This will be treatment #4; NO CHARGE FOR SHOCKWAVE -Recommended good supportive shoes and advised use of custom insert -Cont with fascial brace & night splint daily -Cont daily stretching exercises -Patient to return to office for shockwave or sooner if problems or questions arise.  Landis Martins, DPM

## 2016-11-24 ENCOUNTER — Other Ambulatory Visit: Payer: Self-pay | Admitting: Family Medicine

## 2016-11-24 DIAGNOSIS — Z1231 Encounter for screening mammogram for malignant neoplasm of breast: Secondary | ICD-10-CM

## 2016-12-13 DIAGNOSIS — Z131 Encounter for screening for diabetes mellitus: Secondary | ICD-10-CM | POA: Diagnosis not present

## 2016-12-13 DIAGNOSIS — G4700 Insomnia, unspecified: Secondary | ICD-10-CM | POA: Diagnosis not present

## 2016-12-13 DIAGNOSIS — Z0000 Encounter for general adult medical examination without abnormal findings: Secondary | ICD-10-CM | POA: Diagnosis not present

## 2016-12-16 IMAGING — MR MR SACRUM / SI JOINTS WO CM
4 of 6 series · 19 of 48 positions shown · non-contrast
Comparison: None.

CLINICAL DATA: Persistent low back pain radiating into the sacral
area and feet for 3-4 months. Question sacroiliitis.

EXAM:
MR SACRUM WITHOUT CONTRAST
TECHNIQUE: Multiplanar, multisequence MR imaging was performed. No intravenous
contrast was administered.

[Series 3: T1 · oblique · 4.0mm · 0.47mm/px · 9 of 30 slices shown (1 of 2)]
[im 1/30]
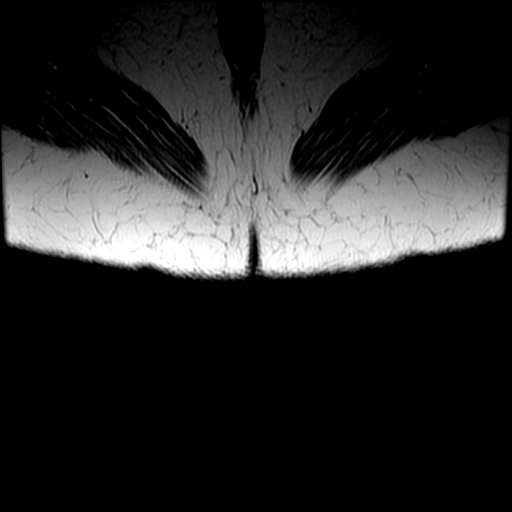
[im 4/30]
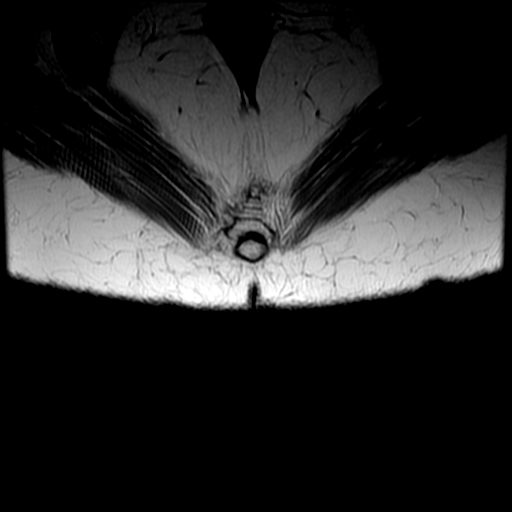
[im 8/30]
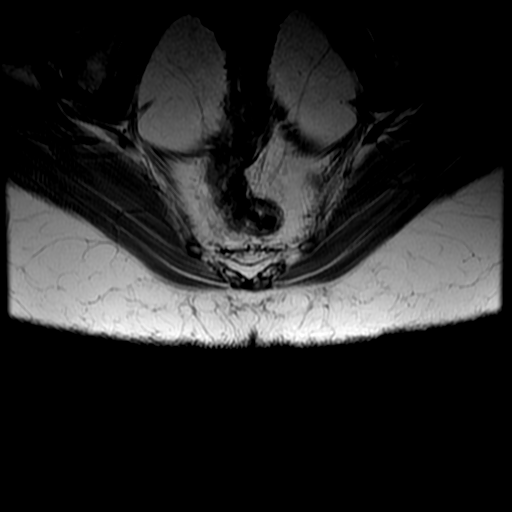
[im 11/30]
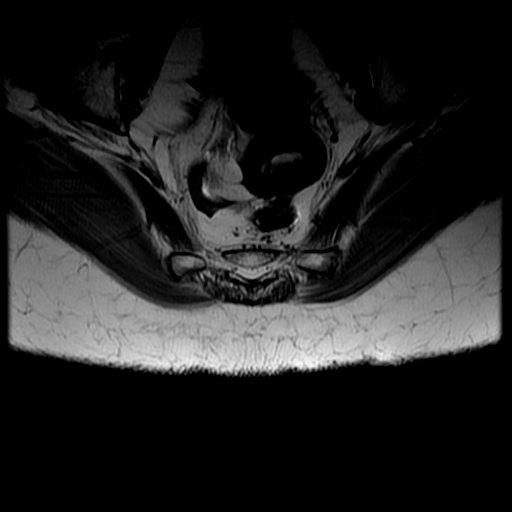
[im 15/30]
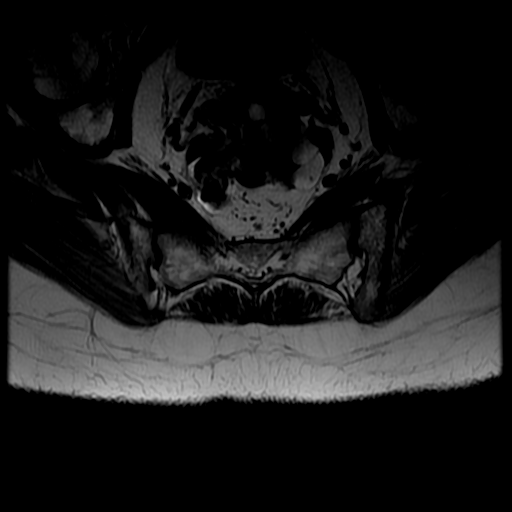
[im 19/30]
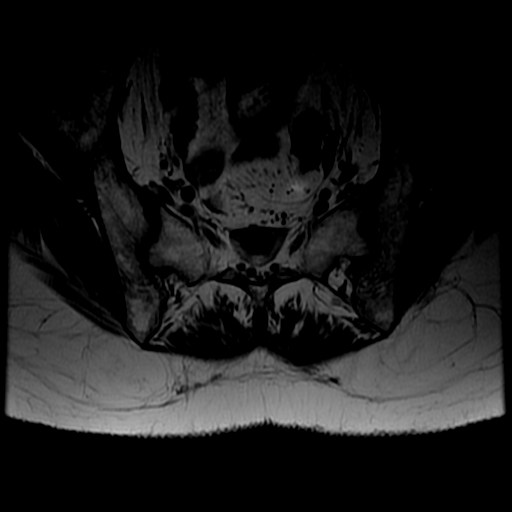
[im 22/30]
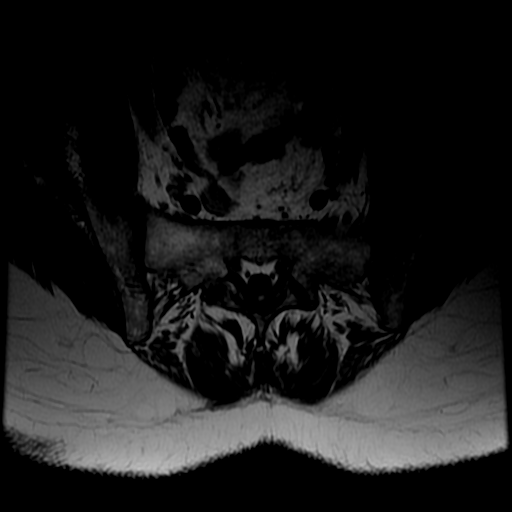
[im 26/30]
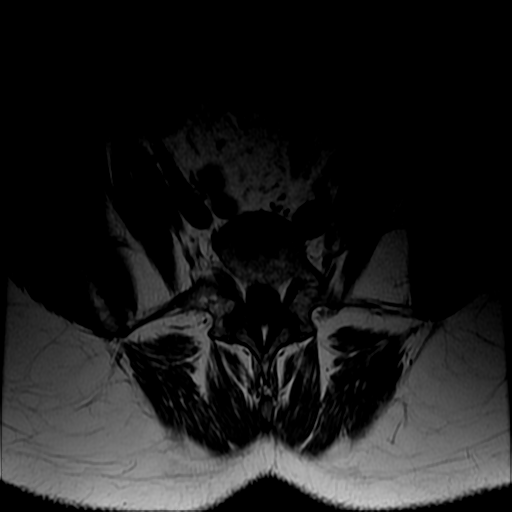
[im 30/30]
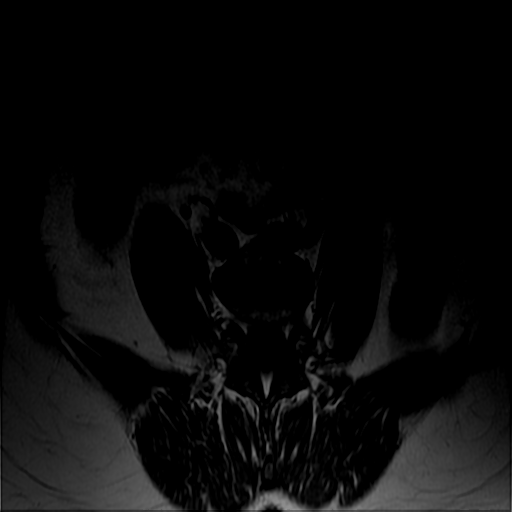

[Series 4: T1 fat-sat · oblique · 4.0mm · 0.47mm/px · 4 of 30 slices shown]
[im 1/30]
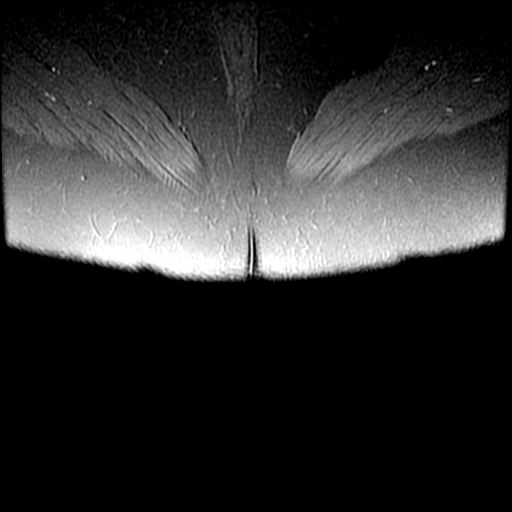
[im 4/30]
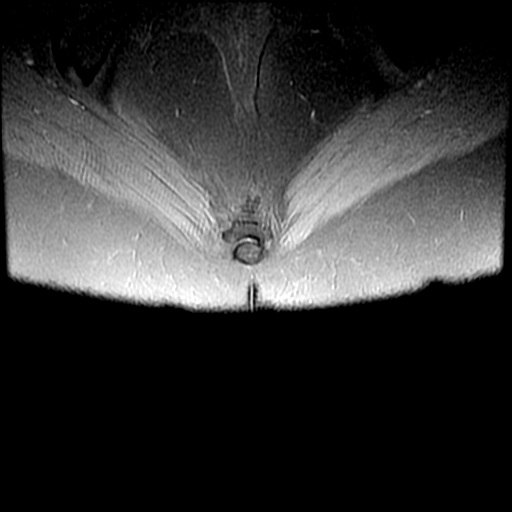
[im 15/30]
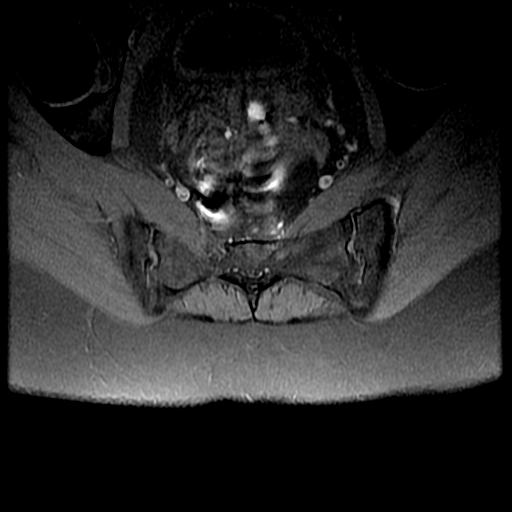
[im 26/30]
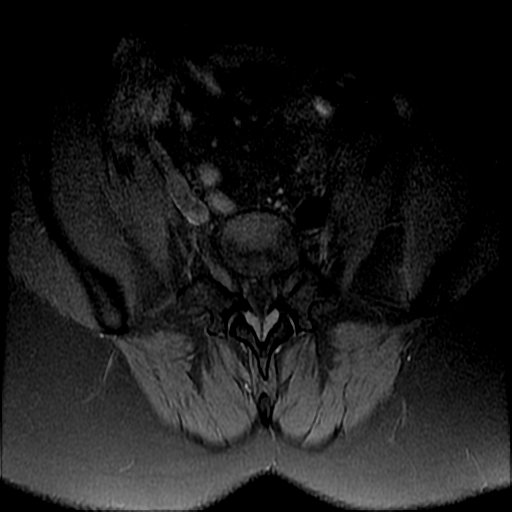

[Series 5: T1 · sagittal · 4.0mm · 0.47mm/px · 3 of 28 slices shown (2 of 2)]
[im 4/28]
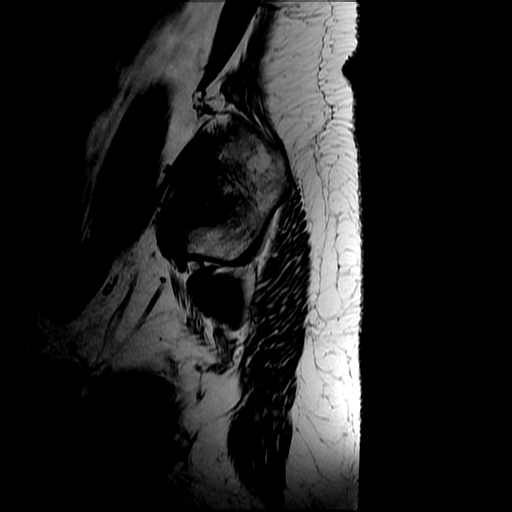
[im 14/28]
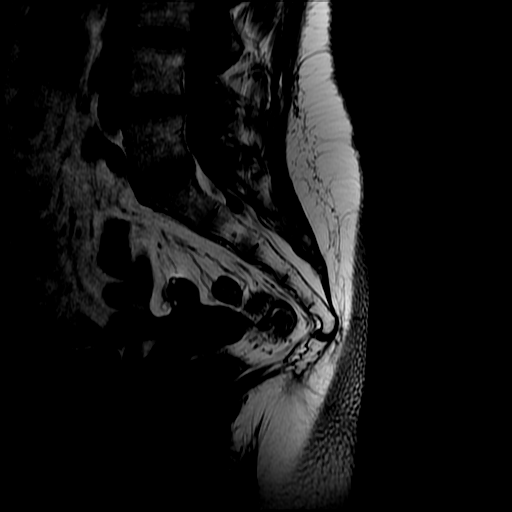
[im 24/28]
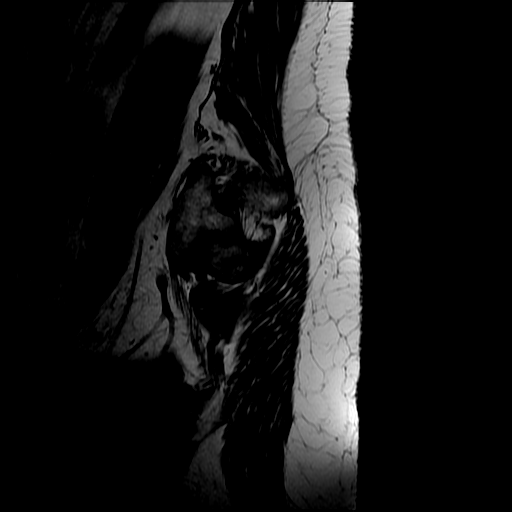

[Series 6: T2 fat-sat · oblique · 4.0mm · 0.47mm/px · 3 of 30 slices shown]
[im 4/30]
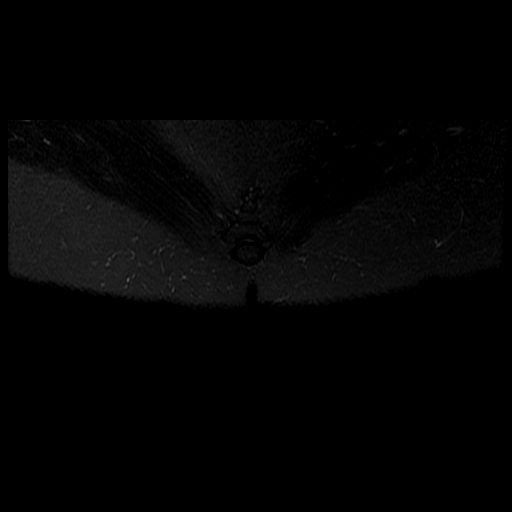
[im 15/30]
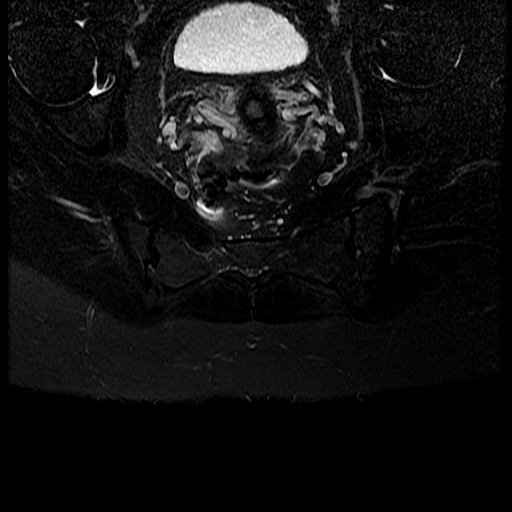
[im 26/30]
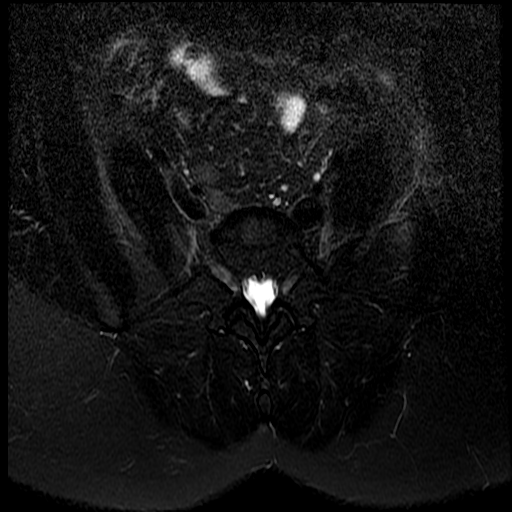

[19 of 48 positions shown; findings below may reference images not displayed]

FINDINGS: The sacroiliac joints appear normal bilaterally without erosion,
sclerosis or edema. No SI joint effusion is present. All imaged
bones demonstrate normal signal. L3-4 to L5-S1 appear normal. The
sacral nerve plexus is normal in appearance. Limited visualization
of the hips is unremarkable. All imaged musculature is normal in
appearance. Imaged intrapelvic contents demonstrate increased T1
signal within the endometrial canal compatible hemorrhage/menses.
IMPRESSION: Normal exam.  No finding to explain the patient's symptoms

## 2017-01-02 ENCOUNTER — Ambulatory Visit (INDEPENDENT_AMBULATORY_CARE_PROVIDER_SITE_OTHER): Payer: 59 | Admitting: Sports Medicine

## 2017-01-02 ENCOUNTER — Encounter: Payer: Self-pay | Admitting: Sports Medicine

## 2017-01-02 DIAGNOSIS — M7661 Achilles tendinitis, right leg: Secondary | ICD-10-CM

## 2017-01-02 DIAGNOSIS — M7662 Achilles tendinitis, left leg: Secondary | ICD-10-CM | POA: Diagnosis not present

## 2017-01-02 DIAGNOSIS — M722 Plantar fascial fibromatosis: Secondary | ICD-10-CM

## 2017-01-02 DIAGNOSIS — M79671 Pain in right foot: Secondary | ICD-10-CM | POA: Diagnosis not present

## 2017-01-02 DIAGNOSIS — M79672 Pain in left foot: Secondary | ICD-10-CM | POA: Diagnosis not present

## 2017-01-02 NOTE — Progress Notes (Signed)
    Patient ID: Lisa Mcbride, female   DOB: 25-Jun-1971, 46 y.o.   MRN: [id number] Subjective: Lisa Mcbride is a 46 y.o. female patient retuns to office for follow up eval of heel pain bilateral after EPAT treatment #4, performed 6 weeks ago, reports that pain is improved some 2/10, not as painful. Denies any other pedal complaints.  There are no active problems to display for this patient.  Current Outpatient Prescriptions on File Prior to Visit  Medication Sig Dispense Refill  . Calcium-Magnesium-Zinc 333-133-5 MG TABS Take by mouth.    . Cholecalciferol (VITAMIN D3) 1000 UNITS CAPS Take by mouth.    . diclofenac (VOLTAREN) 75 MG EC tablet Take 1 tablet (75 mg total) by mouth 2 (two) times daily. 30 tablet 0  . diclofenac (VOLTAREN) 75 MG EC tablet Take 1 tablet (75 mg total) by mouth 2 (two) times daily. 30 tablet 0  . methylPREDNISolone (MEDROL DOSEPAK) 4 MG TBPK tablet Take as instructed 21 tablet 0   No current facility-administered medications on file prior to visit.    Allergies  Allergen Reactions  . Codeine Nausea And Vomiting    Also nausea.     Objective: Physical Exam General: The patient is alert and oriented x3 in no acute distress.  Dermatology: Skin is warm, dry and supple bilateral lower extremities. Nails 1-10 are normal. There is no erythema, edema, no eccymosis, no open lesions present. Integument is otherwise unremarkable.  Vascular: Dorsalis Pedis pulse and Posterior Tibial pulse are 2/4 bilateral. Capillary fill time is immediate to all digits.  Neurological: Grossly intact to light touch with an achilles reflex of +2/5 and a negative Tinel's sign bilateral.  Musculoskeletal: Decreased tenderness to palpation at the medial calcaneal tubercale and through the insertion of the plantar fascia and achilles bilateral feet, L>R. No tenderness to right 5th MTPJ/tailors bunion deformity. No pain with tuning fork to calcaneus bilateral. No pain with calf  compression bilateral. There is mild decreased Ankle joint range of motion bilateral. All other joints range of motion within normal limits bilateral. Strength 5/5 in all groups bilateral.    Assessment and Plan: Problem List Items Addressed This Visit    None    Visit Diagnoses    Plantar fasciitis, bilateral    -  Primary   Achilles tendonitis, bilateral       Right foot pain       Left foot pain         -Complete examination performed. Discussed with patient in detail the condition of plantar fasciitis/tendonitis, how this occurs and general treatment options. Explained both conservative and surgical treatments.  -Recommend NO SURGERY AT THIS TIME because she is making some improving. Advised patient to give it more time before we consider EPF with gastrco recession.  -Recommend another round of EPAT treatment; NO CHARGE FOR SHOCKWAVE -Recommended good supportive shoes and advised use of custom insert -Cont with fascial brace & night splint daily -Cont daily stretching exercises -Patient to follow up again with rheumatology next month  -Patient to return to office for shockwave or sooner if problems or questions arise. Will consider increased or start back running next visit.   Landis Martins, DPM

## 2017-01-11 ENCOUNTER — Ambulatory Visit: Payer: 59

## 2017-01-11 DIAGNOSIS — M722 Plantar fascial fibromatosis: Secondary | ICD-10-CM

## 2017-01-18 ENCOUNTER — Ambulatory Visit (INDEPENDENT_AMBULATORY_CARE_PROVIDER_SITE_OTHER): Payer: 59

## 2017-01-18 DIAGNOSIS — M722 Plantar fascial fibromatosis: Secondary | ICD-10-CM

## 2017-01-19 NOTE — Progress Notes (Signed)
   Subjective:    Patient ID: [place], female    DOB: 06-10-1971, 46 y.o.   MRN: [id number]  HPI Pt presents with bilateral heel pain that has been an ongoing problem for her for several months.She states that since her last round of treatment, her pain is improving. She did wear dress shoes over the weekend which caused her pain to increase   Review of Systems All other systems negative    Objective:   Physical Exam Pain on palpation of bilateral medial heel band left over right 3 of 10. Mild pain at achilles insertional site bilateral when palpated       Assessment & Plan:  ESWT administered to Rt plantar fascial band for 12 joules, pain 5 of 10 for 2500 pulses. ESWT to Lt plantar fascial band for 14 joules pain 7 of 10 for 3000 pulses. EPAT delivered to entire plantar fascial band bilateral for 1500 pulses each site. Tolerated well. Advised her against high impact exercises, NSAIDS and ice during therapy, also advised on boot usage. She is to follow up in 1 week for 3rd treatment.

## 2017-01-19 NOTE — Progress Notes (Signed)
   Subjective:    Patient ID: Alto Denver, female    DOB: January 09, 1971, 46 y.o.   MRN: [id number]  HPI Pt presents with bilateral heel pain that has been an ongoing problem for her for several months.  Review of Systems All other systems negative    Objective:   Physical Exam Pain on palpation of bilateral medial heel band left over right 5 of 10. Mild pain at achilles insertional site bilateral when palpated       Assessment & Plan:  ESWT administered to Rt plantar fascial band for 10 joules, pain 5 of 10 for 2500 pulses. ESWT to Lt plantar fascial band for 12 joules pain 7 of 10 for 3000 pulses. EPAT delivered to entire plantar fascial band bilateral for 1500 pulses each site. Tolerated well. Advised her against high impact exercises, NSAIDS and ice during therapy, also advised on boot usage. She is to follow up next week for 2nd treatment

## 2017-01-25 DIAGNOSIS — E349 Endocrine disorder, unspecified: Secondary | ICD-10-CM | POA: Diagnosis not present

## 2017-01-26 ENCOUNTER — Ambulatory Visit: Payer: 59

## 2017-01-26 DIAGNOSIS — I10 Essential (primary) hypertension: Secondary | ICD-10-CM | POA: Diagnosis not present

## 2017-01-26 DIAGNOSIS — M722 Plantar fascial fibromatosis: Secondary | ICD-10-CM

## 2017-01-26 NOTE — Progress Notes (Signed)
   Subjective:    Patient ID: Lisa Mcbride, female    DOB: 18-Nov-1971, 46 y.o.   MRN: [id number]  HPI Pt presents with bilateral heel pain that has been an ongoing problem for her for several months.She states that since her last round of treatment, her pain is improving. She did wear dress shoes over the weekend which caused her pain to increase   Review of Systems All other systems negative    Objective:   Physical Exam Pain on palpation of bilateral medial heel band left over right 3 of 10. Mild pain at achilles insertional site bilateral when palpated       Assessment & Plan:  ESWT administered to Rt plantar fascial band for 14 joules, pain 5 of 10 for 2500 pulses. ESWT to Lt plantar fascial band for 14 joules pain 7 of 10 for 3000 pulses. EPAT delivered to entire plantar fascial band bilateral for 1500 pulses each site. Tolerated well. Advised her against high impact exercises, NSAIDS and ice during therapy, also advised on boot usage. She is to follow up in 4 weeks for final treatment

## 2017-02-02 DIAGNOSIS — E785 Hyperlipidemia, unspecified: Secondary | ICD-10-CM | POA: Diagnosis not present

## 2017-02-02 DIAGNOSIS — E349 Endocrine disorder, unspecified: Secondary | ICD-10-CM | POA: Diagnosis not present

## 2017-02-02 DIAGNOSIS — I10 Essential (primary) hypertension: Secondary | ICD-10-CM | POA: Diagnosis not present

## 2017-02-26 ENCOUNTER — Ambulatory Visit (INDEPENDENT_AMBULATORY_CARE_PROVIDER_SITE_OTHER): Payer: Self-pay | Admitting: Sports Medicine

## 2017-02-26 DIAGNOSIS — M7662 Achilles tendinitis, left leg: Secondary | ICD-10-CM

## 2017-02-26 DIAGNOSIS — M722 Plantar fascial fibromatosis: Secondary | ICD-10-CM

## 2017-02-26 DIAGNOSIS — M7661 Achilles tendinitis, right leg: Secondary | ICD-10-CM

## 2017-02-26 DIAGNOSIS — M7660 Achilles tendinitis, unspecified leg: Secondary | ICD-10-CM

## 2017-02-27 DIAGNOSIS — M79673 Pain in unspecified foot: Secondary | ICD-10-CM

## 2017-03-01 NOTE — Progress Notes (Signed)
Office Visit Note  Patient: Lisa Mcbride             Date of Birth: 10-29-1971           MRN: [id number]             PCP: Juluis Pitch, MD Referring: No ref. provider found Visit Date: 03/07/2017 Occupation: '@GUAROCC' @    Subjective:  Pain feet   History of Present Illness: Lisa Mcbride is a 46 y.o. female with h/o plantar fascitis. She states for ongoing plantar fasciitis she had physical therapy with dry needling which was unsuccessful. She went to see Dr. Cannon Kettle and tried Shock wave therapy for 4 rounds with minimal help. She states that the symptoms persist that she had another course of treatment. Her symptoms are better but not completely resolved. A new set of orthotics was also ordered which is not  available at this point. She's been having some discomfort in her both knees right shoulder right elbow and lower back. She denies any joint swelling.  Activities of Daily Living:  Patient reports morning stiffness for15  minutes.   Patient Reports nocturnal pain.  Difficulty dressing/grooming: Denies Difficulty climbing stairs: Denies Difficulty getting out of chair: Denies Difficulty using hands for taps, buttons, cutlery, and/or writing: Denies   Review of Systems  Constitutional: Negative for fatigue, night sweats, weight gain, weight loss and weakness.  HENT: Positive for mouth dryness. Negative for mouth sores, trouble swallowing, trouble swallowing and nose dryness.   Eyes: Negative for pain, redness, visual disturbance and dryness.  Respiratory: Negative for cough, shortness of breath and difficulty breathing.   Cardiovascular: Negative for chest pain, palpitations, hypertension, irregular heartbeat and swelling in legs/feet.  Gastrointestinal: Negative for blood in stool, constipation and diarrhea.  Endocrine: Negative for increased urination.  Genitourinary: Negative for vaginal dryness.  Musculoskeletal: Positive for arthralgias, joint pain and morning  stiffness. Negative for joint swelling, myalgias, muscle weakness, muscle tenderness and myalgias.  Skin: Negative for color change, rash, hair loss, skin tightness, ulcers and sensitivity to sunlight.  Allergic/Immunologic: Negative for susceptible to infections.  Neurological: Negative for dizziness, memory loss and night sweats.  Hematological: Negative for swollen glands.  Psychiatric/Behavioral: Positive for sleep disturbance. Negative for depressed mood. The patient is nervous/anxious.     PMFS History:  Patient Active Problem List   Diagnosis Date Noted  . HLA B27 (HLA B27 positive) 03/06/2017  . Bilateral plantar fasciitis 03/06/2017  . Primary insomnia 03/06/2017  . History of depression 03/06/2017    History reviewed. No pertinent past medical history.  No family history on file. History reviewed. No pertinent surgical history. Social History   Social History Narrative  . No narrative on file     Objective: Vital Signs: BP 120/70   Pulse 74   Resp 14   Ht '5\' 2"'  (1.575 m)   Wt 140 lb (63.5 kg)   BMI 25.61 kg/m    Physical Exam  Constitutional: She is oriented to person, place, and time. She appears well-developed and well-nourished.  HENT:  Head: Normocephalic and atraumatic.  Eyes: Conjunctivae and EOM are normal.  Neck: Normal range of motion.  Cardiovascular: Normal rate, regular rhythm, normal heart sounds and intact distal pulses.   Pulmonary/Chest: Effort normal and breath sounds normal.  Abdominal: Soft. Bowel sounds are normal.  Lymphadenopathy:    She has no cervical adenopathy.  Neurological: She is alert and oriented to person, place, and time.  Skin: Skin is warm and  dry. Capillary refill takes less than 2 seconds.  Psychiatric: She has a normal mood and affect. Her behavior is normal.  Nursing note and vitals reviewed.    Musculoskeletal Exam: C-spine and thoracic lumbar spine good range of motion. Shoulder joints elbow joints wrist joint MCPs  PIPs DIPs with good range of motion with no synovitis. Hip joints knee joints ankles MTPs PIPs DIPs with good range of motion with no synovitis. She had tenderness on palpation over bilateral plantar fascia.  CDAI Exam: No CDAI exam completed.    Investigation: Findings:  June 2017:  CBC, BMP, C-reactive protein, sed rate, uric acid and rheumatoid factor were normal.  HLA-B27 was positive.   07/17/2016 X-rays of bilateral feet, 2 views today, showed bilateral MTP, PIP, and DIP were normal.  There was a large left calcaneal spur.  Bilateral hand x-rays, 2 views, were within normal limits without any joint space narrowing or erosive changes.  A pelvis x-ray was also unremarkable.  Lumbar spine x-ray did not show any syndesmophytes or squaring of vertebrae.    Labs from July 17, 2016, show CMP with GFR is normal.  Urinalysis is normal.  Hepatitis panel is negative.  TB Gold is negative.  Angiotensin-1 converting enzyme is negative.  SPEP is negative.  CCP is negative.  Note that on May 19, 2016, her CBC was normal.  BMP was normal.  CRP was normal.  Sed rate normal.  Uric acid at 3.7.  Rheumatoid factor negative.  HLA-B27 was positive.  For that reason we did an MRI, Sacral  which was normal.       Imaging: No results found.  Speciality Comments: No specialty comments available.    Procedures:  No procedures performed Allergies: Codeine   Assessment / Plan:     Visit Diagnoses: HLA B27 (HLA B27 positive): She has no synovitis on examination today. Although she continues to have multiple arthralgias.  Bilateral plantar fasciitis: She is persistent recurrent plantar fasciitis. Despite having shock therapy 2 courses she's still having ongoing discomfort. He had detailed discussion regarding different treatment options. We discussed possible use of NSAIDs for couple of months and see if her symptoms get better if not we can try sulfasalazine as a trial. I will call in a prescription for Mobic  15 mg by mouth daily which should be taken with meals. Side effects were discussed.  Primary insomnia: Secondary to pain  History of depression and anxiety due to pain   Orders: Orders Placed This Encounter  Procedures  . CBC with Differential/Platelet  . COMPLETE METABOLIC PANEL WITH GFR  . Glucose 6 phosphate dehydrogenase   Meds ordered this encounter  Medications  . meloxicam (MOBIC) 15 MG tablet    Sig: Take 1 tablet (15 mg total) by mouth daily as needed for pain.    Dispense:  30 tablet    Refill:  2    Face-to-face time spent with patient was 30 minutes. 50% of time was spent in counseling and coordination of care.  Follow-Up Instructions: Return in about 3 months (around 06/07/2017) for Plantar fasciitis.   Bo Merino, MD  Note - This record has been created using Editor, commissioning.  Chart creation errors have been sought, but may not always  have been located. Such creation errors do not reflect on  the standard of medical care.

## 2017-03-01 NOTE — Progress Notes (Signed)
   Subjective:    Patient ID: Lisa Mcbride, female    DOB: 01-05-1971, 46 y.o.   MRN: [id number]  HPI Pt presents with bilateral heel pain that has been an ongoing problem for her for several months.She states that since her last round of treatment, her pain is improving.    Review of Systems All other systems negative    Objective:   Physical Exam Pain on palpation of bilateral medial heel band left over right 3 of 10. Mild pain at achilles insertional site bilateral when palpated       Assessment & Plan:  ESWT administered to Rt plantar fascial band for 15 joules, pain 5 of 10 for 2500 pulses. ESWT to Lt plantar fascial band for 15 joules pain 7 of 10 for 3000 pulses. EPAT delivered to entire plantar fascial band bilateral for 1500 pulses each site. Tolerated well. Advised her against high impact exercises, NSAIDS and ice during therapy, also advised on boot usage. She is to follow up in 4 weeks for final treatment

## 2017-03-06 DIAGNOSIS — M722 Plantar fascial fibromatosis: Secondary | ICD-10-CM | POA: Insufficient documentation

## 2017-03-06 DIAGNOSIS — Z8659 Personal history of other mental and behavioral disorders: Secondary | ICD-10-CM | POA: Insufficient documentation

## 2017-03-06 DIAGNOSIS — Z1589 Genetic susceptibility to other disease: Secondary | ICD-10-CM | POA: Insufficient documentation

## 2017-03-06 DIAGNOSIS — F5101 Primary insomnia: Secondary | ICD-10-CM | POA: Insufficient documentation

## 2017-03-07 ENCOUNTER — Encounter: Payer: Self-pay | Admitting: Rheumatology

## 2017-03-07 ENCOUNTER — Ambulatory Visit (INDEPENDENT_AMBULATORY_CARE_PROVIDER_SITE_OTHER): Payer: 59 | Admitting: Rheumatology

## 2017-03-07 VITALS — BP 120/70 | HR 74 | Resp 14 | Ht 62.0 in | Wt 140.0 lb

## 2017-03-07 DIAGNOSIS — Z1589 Genetic susceptibility to other disease: Secondary | ICD-10-CM | POA: Diagnosis not present

## 2017-03-07 DIAGNOSIS — Z8659 Personal history of other mental and behavioral disorders: Secondary | ICD-10-CM | POA: Diagnosis not present

## 2017-03-07 DIAGNOSIS — Z79899 Other long term (current) drug therapy: Secondary | ICD-10-CM | POA: Diagnosis not present

## 2017-03-07 DIAGNOSIS — M722 Plantar fascial fibromatosis: Secondary | ICD-10-CM | POA: Diagnosis not present

## 2017-03-07 DIAGNOSIS — F5101 Primary insomnia: Secondary | ICD-10-CM

## 2017-03-07 MED ORDER — MELOXICAM 15 MG PO TABS: 15.0000 mg | ORAL_TABLET | Freq: Every day | ORAL | 2 refills | Status: DC | PRN

## 2017-03-07 NOTE — Patient Instructions (Signed)
Get labs 1 month after starting Mobic

## 2017-03-08 DIAGNOSIS — E349 Endocrine disorder, unspecified: Secondary | ICD-10-CM | POA: Diagnosis not present

## 2017-03-13 NOTE — Progress Notes (Signed)
Patient was also casted for a new set of custom molded foot orthotics for fasciitis and foot pain. Patient to return to pick up orthotics when arrive. -Dr. Cannon Kettle

## 2017-03-19 ENCOUNTER — Ambulatory Visit: Payer: 59 | Admitting: *Deleted

## 2017-03-19 NOTE — Progress Notes (Signed)
Patient p/up FO today..Called patient.

## 2017-04-10 ENCOUNTER — Ambulatory Visit (INDEPENDENT_AMBULATORY_CARE_PROVIDER_SITE_OTHER): Payer: 59 | Admitting: Sports Medicine

## 2017-04-10 ENCOUNTER — Encounter: Payer: Self-pay | Admitting: Sports Medicine

## 2017-04-10 DIAGNOSIS — M722 Plantar fascial fibromatosis: Secondary | ICD-10-CM

## 2017-04-10 DIAGNOSIS — M7662 Achilles tendinitis, left leg: Secondary | ICD-10-CM

## 2017-04-10 DIAGNOSIS — M79671 Pain in right foot: Secondary | ICD-10-CM

## 2017-04-10 DIAGNOSIS — M7661 Achilles tendinitis, right leg: Secondary | ICD-10-CM | POA: Diagnosis not present

## 2017-04-10 DIAGNOSIS — M79672 Pain in left foot: Secondary | ICD-10-CM | POA: Diagnosis not present

## 2017-04-10 MED ORDER — METHYLPREDNISOLONE 4 MG PO TBPK: ORAL_TABLET | ORAL | 0 refills | Status: DC

## 2017-04-10 NOTE — Progress Notes (Signed)
Patient ID: Lisa Mcbride, female   DOB: 1971/05/28, 46 y.o.   MRN: [id number] Subjective: Lisa Mcbride is a 46 y.o. female patient retuns to office for follow up eval of heel pain bilateral after 2nd round of EPAT treatment #4, performed 6 weeks ago, reports that she still has pain in her heels, 2/10, not as painful but constant. Patient is also here to pick up orthotics. Denies any other pedal complaints.  Patient Active Problem List   Diagnosis Date Noted  . HLA B27 (HLA B27 positive) 03/06/2017  . Bilateral plantar fasciitis 03/06/2017  . Primary insomnia 03/06/2017  . History of depression 03/06/2017   Current Outpatient Prescriptions on File Prior to Visit  Medication Sig Dispense Refill  . Cholecalciferol (VITAMIN D3) 1000 UNITS CAPS Take by mouth.    . citalopram (CELEXA) 20 MG tablet   12  . diazepam (VALIUM) 5 MG tablet Take by mouth.    . meloxicam (MOBIC) 15 MG tablet Take 1 tablet (15 mg total) by mouth daily as needed for pain. 30 tablet 2  . Omega-3 Fatty Acids (FISH OIL) 1000 MG CAPS Take by mouth.    . traZODone (DESYREL) 100 MG tablet TAKE 1 TABLET BY MOUTH NIGHTLY.     No current facility-administered medications on file prior to visit.    Allergies  Allergen Reactions  . Codeine Nausea And Vomiting    Also nausea.     Objective: Physical Exam General: The patient is alert and oriented x3 in no acute distress.  Dermatology: Skin is warm, dry and supple bilateral lower extremities. Nails 1-10 are normal. There is no erythema, edema, no eccymosis, no open lesions present. Integument is otherwise unremarkable.  Vascular: Dorsalis Pedis pulse and Posterior Tibial pulse are 2/4 bilateral. Capillary fill time is immediate to all digits.  Neurological: Grossly intact to light touch with an achilles reflex of +2/5 and a negative Tinel's sign bilateral.  Musculoskeletal: Decreased tenderness to palpation at the medial calcaneal tubercale and through the  insertion of the plantar fascia and achilles bilateral feet, L>R. No tenderness to right 5th MTPJ/tailors bunion deformity. No pain with tuning fork to calcaneus bilateral. No pain with calf compression bilateral. There is mild decreased Ankle joint range of motion bilateral. All other joints range of motion within normal limits bilateral. Strength 5/5 in all groups bilateral.    Assessment and Plan: Problem List Items Addressed This Visit    None    Visit Diagnoses    Plantar fasciitis, bilateral    -  Primary   Achilles tendonitis, bilateral       Foot pain, bilateral         -Complete examination performed. Discussed continued care for fasciitis/tendonitis -Recommended good supportive shoes and dispensed new set of custom orthotics with break in period explained -Rx Medrol dose pack -Cont with Mobic as given by Dr. Dora Sims after dose pack is completed   -Cont daily stretching exercises -Patient to return to office in 8 weeks for orthotic check and for follow up discussion on surgery if not better plantar fasciitis/achilles tendonitis   Landis Martins, DPM

## 2017-04-10 NOTE — Patient Instructions (Signed)

## 2017-04-18 DIAGNOSIS — Z23 Encounter for immunization: Secondary | ICD-10-CM | POA: Diagnosis not present

## 2017-04-18 DIAGNOSIS — S70362A Insect bite (nonvenomous), left thigh, initial encounter: Secondary | ICD-10-CM | POA: Diagnosis not present

## 2017-04-18 DIAGNOSIS — W57XXXA Bitten or stung by nonvenomous insect and other nonvenomous arthropods, initial encounter: Secondary | ICD-10-CM | POA: Diagnosis not present

## 2017-04-18 DIAGNOSIS — S60562A Insect bite (nonvenomous) of left hand, initial encounter: Secondary | ICD-10-CM | POA: Diagnosis not present

## 2017-05-02 ENCOUNTER — Telehealth: Payer: Self-pay | Admitting: *Deleted

## 2017-05-02 ENCOUNTER — Encounter: Payer: Self-pay | Admitting: Sports Medicine

## 2017-05-02 ENCOUNTER — Ambulatory Visit (INDEPENDENT_AMBULATORY_CARE_PROVIDER_SITE_OTHER): Payer: 59

## 2017-05-02 ENCOUNTER — Other Ambulatory Visit: Payer: Self-pay | Admitting: Sports Medicine

## 2017-05-02 ENCOUNTER — Ambulatory Visit (INDEPENDENT_AMBULATORY_CARE_PROVIDER_SITE_OTHER): Payer: 59 | Admitting: Sports Medicine

## 2017-05-02 DIAGNOSIS — M7750 Other enthesopathy of unspecified foot: Secondary | ICD-10-CM | POA: Diagnosis not present

## 2017-05-02 DIAGNOSIS — R52 Pain, unspecified: Secondary | ICD-10-CM

## 2017-05-02 DIAGNOSIS — M19072 Primary osteoarthritis, left ankle and foot: Secondary | ICD-10-CM

## 2017-05-02 DIAGNOSIS — M778 Other enthesopathies, not elsewhere classified: Secondary | ICD-10-CM

## 2017-05-02 DIAGNOSIS — M779 Enthesopathy, unspecified: Secondary | ICD-10-CM

## 2017-05-02 NOTE — Telephone Encounter (Signed)
Pt states she is having pain at the top of her left foot near her ankle. I spoke with pt and encouraged her to continue the stretches and home PT recommended by Dr. Cannon Kettle, if her athletic shoes were old to get new that will hold and support her orthotics and help support the structures of the plantar foot which should help the structure of the top of the foot. I transferred pt to schedulers to get pt in earlier than 06/2017 to see Dr. Cannon Kettle.

## 2017-05-02 NOTE — Progress Notes (Signed)
Patient ID: Lisa Mcbride, female   DOB: 1971/11/02, 46 y.o.   MRN: [id number] Subjective: Lisa Mcbride is a 46 y.o. female patient presents with new pain at left ankle that started after YOGA 3 days ago that hurts to bend the ankle. Denies any other pedal complaints.  Patient Active Problem List   Diagnosis Date Noted  . HLA B27 (HLA B27 positive) 03/06/2017  . Bilateral plantar fasciitis 03/06/2017  . Primary insomnia 03/06/2017  . History of depression 03/06/2017   Current Outpatient Prescriptions on File Prior to Visit  Medication Sig Dispense Refill  . Cholecalciferol (VITAMIN D3) 1000 UNITS CAPS Take by mouth.    . citalopram (CELEXA) 20 MG tablet   12  . diazepam (VALIUM) 5 MG tablet Take by mouth.    . meloxicam (MOBIC) 15 MG tablet Take 1 tablet (15 mg total) by mouth daily as needed for pain. 30 tablet 2  . methylPREDNISolone (MEDROL DOSEPAK) 4 MG TBPK tablet Take as instructed 21 tablet 0  . Omega-3 Fatty Acids (FISH OIL) 1000 MG CAPS Take by mouth.    . traZODone (DESYREL) 100 MG tablet TAKE 1 TABLET BY MOUTH NIGHTLY.     No current facility-administered medications on file prior to visit.    Allergies  Allergen Reactions  . Codeine Nausea And Vomiting    Also nausea.     Objective: Physical Exam General: The patient is alert and oriented x3 in no acute distress.  Dermatology: Skin is warm, dry and supple bilateral lower extremities. Nails 1-10 are normal. There is no erythema, edema, no eccymosis, no open lesions present. Integument is otherwise unremarkable.  Vascular: Dorsalis Pedis pulse and Posterior Tibial pulse are 2/4 bilateral. Capillary fill time is immediate to all digits.  Neurological: Grossly intact to light touch with an achilles reflex of +2/5 and a negative Tinel's sign bilateral.  Musculoskeletal: There is tenderness with palpation to anterior ankle over extensor tendons on left. No palpable dell or gap. Decreased tenderness to  palpation at the medial calcaneal tubercale and through the insertion of the plantar fascia and achilles bilateral feet, L>R. No tenderness to right 5th MTPJ/tailors bunion deformity. No pain with tuning fork to calcaneus bilateral. No pain with calf compression bilateral. There is mild decreased Ankle joint range of motion bilateral. All other joints range of motion within normal limits bilateral. Strength 5/5 in all groups bilateral.    Assessment and Plan: Problem List Items Addressed This Visit    None    Visit Diagnoses    Pain    -  Primary   Extensor tendonitis of foot       Arthritis of left ankle         -Complete examination performed. Discussed care for new acute tendonitis at left ankle -Recommended return to CAM boot for 2 weeks -No Yoga or exercise -Cont with Mobic as given by Dr. Dora Sims  -Recommend PRICE protocol  -Patient to return to office in 2 weeks for follow up eval or left ankle pain or sooner if problems or issues arise.   Landis Martins, DPM

## 2017-05-18 ENCOUNTER — Ambulatory Visit (INDEPENDENT_AMBULATORY_CARE_PROVIDER_SITE_OTHER): Payer: 59 | Admitting: Sports Medicine

## 2017-05-18 DIAGNOSIS — M7661 Achilles tendinitis, right leg: Secondary | ICD-10-CM | POA: Diagnosis not present

## 2017-05-18 DIAGNOSIS — M722 Plantar fascial fibromatosis: Secondary | ICD-10-CM | POA: Diagnosis not present

## 2017-05-18 DIAGNOSIS — M7750 Other enthesopathy of unspecified foot: Secondary | ICD-10-CM | POA: Diagnosis not present

## 2017-05-18 DIAGNOSIS — M7662 Achilles tendinitis, left leg: Secondary | ICD-10-CM | POA: Diagnosis not present

## 2017-05-18 DIAGNOSIS — M779 Enthesopathy, unspecified: Principal | ICD-10-CM

## 2017-05-18 DIAGNOSIS — M778 Other enthesopathies, not elsewhere classified: Secondary | ICD-10-CM

## 2017-05-18 NOTE — Progress Notes (Signed)
Patient ID: Lisa Mcbride, female   DOB: 09-25-1971, 46 y.o.   MRN: [id number] Subjective: Lisa Mcbride is a 46 y.o. female patient returns for follow-up of left foot and ankle pain. Patient reports that after wearing cam boot Her foot and ankle pain is surprisingly better. Patient does admit to still have ongoing chronic plantar fasciitis and Achilles tendinitis, which limits her in her activity. Denies any other pedal complaints.  Patient Active Problem List   Diagnosis Date Noted  . HLA B27 (HLA B27 positive) 03/06/2017  . Bilateral plantar fasciitis 03/06/2017  . Primary insomnia 03/06/2017  . History of depression 03/06/2017   Current Outpatient Prescriptions on File Prior to Visit  Medication Sig Dispense Refill  . Cholecalciferol (VITAMIN D3) 1000 UNITS CAPS Take by mouth.    . citalopram (CELEXA) 20 MG tablet   12  . diazepam (VALIUM) 5 MG tablet Take by mouth.    . meloxicam (MOBIC) 15 MG tablet Take 1 tablet (15 mg total) by mouth daily as needed for pain. 30 tablet 2  . methylPREDNISolone (MEDROL DOSEPAK) 4 MG TBPK tablet Take as instructed 21 tablet 0  . Omega-3 Fatty Acids (FISH OIL) 1000 MG CAPS Take by mouth.    . traZODone (DESYREL) 100 MG tablet TAKE 1 TABLET BY MOUTH NIGHTLY.     No current facility-administered medications on file prior to visit.    Allergies  Allergen Reactions  . Codeine Nausea And Vomiting    Also nausea.     Objective: Physical Exam General: The patient is alert and oriented x3 in no acute distress.  Dermatology: Skin is warm, dry and supple bilateral lower extremities. Nails 1-10 are normal. There is no erythema, edema, no eccymosis, no open lesions present. Integument is otherwise unremarkable.  Vascular: Dorsalis Pedis pulse and Posterior Tibial pulse are 2/4 bilateral. Capillary fill time is immediate to all digits.  Neurological: Grossly intact to light touch with an achilles reflex of +2/5 and a negative Tinel's sign  bilateral.  Musculoskeletal: There is No tenderness with palpation to anterior ankle over extensor tendons on left. No palpable dell or gap. Unchanged tenderness to palpation at the medial calcaneal tubercale and through the insertion of the plantar fascia and achilles bilateral feet, L>R. No tenderness to right 5th MTPJ/tailors bunion deformity. No pain with tuning fork to calcaneus bilateral. No pain with calf compression bilateral. There is mild decreased Ankle joint range of motion bilateral. All other joints range of motion within normal limits bilateral. Strength 5/5 in all groups bilateral.    Assessment and Plan: Problem List Items Addressed This Visit    None    Visit Diagnoses    Extensor tendonitis of foot    -  Primary   Left foot, improved   Plantar fasciitis, bilateral       Achilles tendonitis, bilateral         -Complete examination performed -Discussed long term care for left ankle tendinitis -Patient to slowly transition out of cam boot as instructed on left -Continue with no Yoga or strenuous exercise to prevent flareup -Cont with Mobic as given by Dr. Dora Sims  -Recommend PRICE protocol  -Advised patient to consider surgery for chronic plantar fasciitis and Achilles tendinitis. Patient states that she will think about these options and will call back when she is ready for surgical consult. Patient is considering starting with surgery, first on the left foot since it is the most symptomatic and has been bothering her the  longest discussed with patient that she will require 2 weeks of nonweightbearing followed by 2 weeks of weightbearing with the cam boot followed by transition to postoperative shoe and that we will have to wait about 8 weeks before attempting to do the right foot. Advised patient that she will need to take at least 2 weeks off from work during her recovery at minimum. -Patient to return to office as needed or sooner if problems or issues arise.   Landis Martins, DPM

## 2017-05-21 ENCOUNTER — Other Ambulatory Visit: Payer: Self-pay

## 2017-05-21 DIAGNOSIS — Z79899 Other long term (current) drug therapy: Secondary | ICD-10-CM

## 2017-05-21 LAB — COMPLETE METABOLIC PANEL WITH GFR
ALBUMIN: 4.2 g/dL (ref 3.6–5.1)
ALK PHOS: 79 U/L (ref 33–115)
ALT: 14 U/L (ref 6–29)
AST: 17 U/L (ref 10–35)
BILIRUBIN TOTAL: 0.5 mg/dL (ref 0.2–1.2)
BUN: 18 mg/dL (ref 7–25)
CALCIUM: 9.5 mg/dL (ref 8.6–10.2)
CO2: 25 mmol/L (ref 20–31)
Chloride: 102 mmol/L (ref 98–110)
Creat: 0.87 mg/dL (ref 0.50–1.10)
GFR, EST NON AFRICAN AMERICAN: 80 mL/min (ref >=60)
Glucose, Bld: 83 mg/dL (ref 65–99)
POTASSIUM: 4.8 mmol/L (ref 3.5–5.3)
SODIUM: 136 mmol/L (ref 135–146)
TOTAL PROTEIN: 7 g/dL (ref 6.1–8.1)

## 2017-05-21 LAB — CBC WITH DIFFERENTIAL/PLATELET
BASOS ABS: 0 {cells}/uL (ref 0–200)
Basophils Relative: 0 %
EOS ABS: 192 {cells}/uL (ref 15–500)
EOS PCT: 4 %
HCT: 38 % (ref 35.0–45.0)
HEMOGLOBIN: 12.3 g/dL (ref 11.7–15.5)
LYMPHS ABS: 1632 {cells}/uL (ref 850–3900)
Lymphocytes Relative: 34 %
MCH: 28.2 pg (ref 27.0–33.0)
MCHC: 32.4 g/dL (ref 32.0–36.0)
MCV: 87.2 fL (ref 80.0–100.0)
MONOS PCT: 10 %
MPV: 8.7 fL (ref 7.5–12.5)
Monocytes Absolute: 480 cells/uL (ref 200–950)
NEUTROS ABS: 2496 {cells}/uL (ref 1500–7800)
NEUTROS PCT: 52 %
Platelets: 221 10*3/uL (ref 140–400)
RBC: 4.36 MIL/uL (ref 3.80–5.10)
RDW: 13.5 % (ref 11.0–15.0)
WBC: 4.8 10*3/uL (ref 3.8–10.8)

## 2017-05-22 LAB — GLUCOSE 6 PHOSPHATE DEHYDROGENASE: G-6PDH: 14.1 U/g{Hb} (ref 7.0–20.5)

## 2017-05-22 NOTE — Progress Notes (Signed)
wnl

## 2017-05-23 ENCOUNTER — Telehealth: Payer: Self-pay | Admitting: Radiology

## 2017-05-23 NOTE — Telephone Encounter (Signed)
-----   Message from Bo Merino, MD sent at 05/22/2017  4:52 PM EDT ----- wnl

## 2017-05-23 NOTE — Telephone Encounter (Signed)
I have called patient to advise labs are normal  

## 2017-05-30 DIAGNOSIS — I10 Essential (primary) hypertension: Secondary | ICD-10-CM | POA: Diagnosis not present

## 2017-05-30 DIAGNOSIS — M545 Low back pain: Secondary | ICD-10-CM | POA: Diagnosis not present

## 2017-05-30 DIAGNOSIS — E119 Type 2 diabetes mellitus without complications: Secondary | ICD-10-CM | POA: Diagnosis not present

## 2017-06-07 ENCOUNTER — Encounter: Payer: Self-pay | Admitting: Rheumatology

## 2017-06-07 ENCOUNTER — Ambulatory Visit: Payer: 59 | Admitting: Rheumatology

## 2017-06-07 ENCOUNTER — Ambulatory Visit (INDEPENDENT_AMBULATORY_CARE_PROVIDER_SITE_OTHER): Payer: 59 | Admitting: Rheumatology

## 2017-06-07 VITALS — BP 122/68 | HR 74 | Resp 14 | Ht 62.0 in | Wt 144.0 lb

## 2017-06-07 DIAGNOSIS — Z1589 Genetic susceptibility to other disease: Secondary | ICD-10-CM | POA: Diagnosis not present

## 2017-06-07 DIAGNOSIS — M791 Myalgia, unspecified site: Secondary | ICD-10-CM

## 2017-06-07 DIAGNOSIS — F5101 Primary insomnia: Secondary | ICD-10-CM | POA: Diagnosis not present

## 2017-06-07 DIAGNOSIS — M722 Plantar fascial fibromatosis: Secondary | ICD-10-CM

## 2017-06-07 DIAGNOSIS — Z79899 Other long term (current) drug therapy: Secondary | ICD-10-CM | POA: Diagnosis not present

## 2017-06-07 MED ORDER — SULFASALAZINE 500 MG PO TABS: 1000.0000 mg | ORAL_TABLET | Freq: Two times a day (BID) | ORAL | 2 refills | Status: DC

## 2017-06-07 NOTE — Progress Notes (Signed)
Pharmacy Note  Subjective:  Patient presents today to the [facility] to see Dr. Lacy Duverney. [place].  Patient seen by pharmacist for counseling on sulfasalazine.    Objective: CMP     Component Value Date/Time   NA 136 05/21/2017 0948   NA 140 05/12/2016 0927   K 4.8 05/21/2017 0948   CL 102 05/21/2017 0948   CO2 25 05/21/2017 0948   GLUCOSE 83 05/21/2017 0948   BUN 18 05/21/2017 0948   BUN 21 05/12/2016 0927   CREATININE 0.87 05/21/2017 0948   CALCIUM 9.5 05/21/2017 0948   PROT 7.0 05/21/2017 0948   ALBUMIN 4.2 05/21/2017 0948   AST 17 05/21/2017 0948   ALT 14 05/21/2017 0948   ALKPHOS 79 05/21/2017 0948   BILITOT 0.5 05/21/2017 0948   GFRNONAA 80 05/21/2017 0948   GFRAA >89 05/21/2017 0948   CBC    Component Value Date/Time   WBC 4.8 05/21/2017 0948   RBC 4.36 05/21/2017 0948   HGB 12.3 05/21/2017 0948   HGB 13.2 05/12/2016 0927   HCT 38.0 05/21/2017 0948   HCT 39.1 05/12/2016 0927   PLT 221 05/21/2017 0948   PLT 235 05/12/2016 0927   MCV 87.2 05/21/2017 0948   MCV 88 05/12/2016 0927   MCV 85 07/16/2013 1053   MCH 28.2 05/21/2017 0948   MCHC 32.4 05/21/2017 0948   RDW 13.5 05/21/2017 0948   RDW 13.7 05/12/2016 0927   RDW 12.3 07/16/2013 1053   LYMPHSABS 1,632 05/21/2017 0948   LYMPHSABS 1.7 05/12/2016 0927   MONOABS 480 05/21/2017 0948   EOSABS 192 05/21/2017 0948   EOSABS 0.2 05/12/2016 0927   BASOSABS 0 05/21/2017 0948   BASOSABS 0.0 05/12/2016 0927   G6PD: normal (05/21/17)  Assessment/Plan:   Patient was counseled on the purpose, proper use, and adverse effects of sulfasalazine including risk of infection and chance of nausea, headache, and sun sensitivity.  Also discussed risk of skin rash and advised patient to stop the medication and let us know if she develops a rash. Also discussed for the potential of discoloration of the urine, sweat, or tears.  Reviewed the importance of frequent labs to monitor liver, kidneys, and blood counts;  and patient voiced understanding.  Provided patient with educational materials on sulfasalazine and answered all questions.    Elisabeth Most, Pharm.D., BCPS Clinical Pharmacist Pager: [phone] Phone: [phone] 06/07/2017 10:44 AM

## 2017-06-07 NOTE — Progress Notes (Addendum)
Office Visit Note  Patient: Lisa Mcbride             Date of Birth: [birth year]           MRN: [id number]             PCP: Juluis Pitch, MD Referring: Juluis Pitch, MD Visit Date: 06/07/2017 Occupation: '@GUAROCC' @    Subjective:  Plantar fascitis   History of Present Illness: Lisa Mcbride is a 46 y.o. female  Last seen 03/07/2017 for positive HLA-B27 but no synovitis on examination however had arthralgia reported on March 2018 visit. In addition, patient also had complaints of plantar fasciitis bilaterally. Dr. Estanislado Pandy prescribed Mobic 15 mg daily as a trial for her plantar fasciitis symptoms. If that did not help the patient, they discussed trying sulfasalazine.  Today, patient was requested to return so he can discuss plantar fasciitis. Still has plantar fasciitis pain. Trial of meloxicam for 2 months gave no relief. While patient was using meloxicam, she did notice her right shoulder improved. The right shoulder pain was about a 5 or 6 on a scale of 0-10 with the meloxicam trial, patient's pain is about a 2-3 on a scale of 0-10.  Patient reports that her podiatrist has suggested surgery for plantar fasciitis.  Pt states she has injuries that does not heal as quickly as they should. Gives examples of lingering pain from various minor injuries. Blood draw site from 3 weeks still is uncomfortable and painful.    Activities of Daily Living:  Patient reports morning stiffness for 15 minutes.   Patient Reports nocturnal pain.  Difficulty dressing/grooming: Denies Difficulty climbing stairs: Denies Difficulty getting out of chair: Denies Difficulty using hands for taps, buttons, cutlery, and/or writing: Denies   Review of Systems  Constitutional: Positive for fatigue.  HENT: Negative for mouth sores and mouth dryness.   Eyes: Negative for dryness.  Respiratory: Negative for shortness of breath.   Gastrointestinal: Negative for constipation and diarrhea.    Musculoskeletal: Positive for myalgias and myalgias.  Skin: Negative for sensitivity to sunlight.  Psychiatric/Behavioral: Positive for sleep disturbance. Negative for decreased concentration.    PMFS History:  Patient Active Problem List   Diagnosis Date Noted  . HLA B27 (HLA B27 positive) 03/06/2017  . Bilateral plantar fasciitis 03/06/2017  . Primary insomnia 03/06/2017  . History of depression 03/06/2017    History reviewed. No pertinent past medical history.  No family history on file. Past Surgical History:  Procedure Laterality Date  . ABLATION ON ENDOMETRIOSIS  2014  . TONSILLECTOMY     Social History   Social History Narrative  . No narrative on file     Objective: Vital Signs: BP 122/68   Pulse 74   Resp 14   Ht '5\' 2"'  (1.575 m)   Wt 144 lb (65.3 kg)   BMI 26.34 kg/m    Physical Exam  Constitutional: She is oriented to person, place, and time. She appears well-developed and well-nourished.  HENT:  Head: Normocephalic and atraumatic.  Eyes: EOM are normal. Pupils are equal, round, and reactive to light.  Cardiovascular: Normal rate, regular rhythm and normal heart sounds.  Exam reveals no gallop and no friction rub.   No murmur heard. Pulmonary/Chest: Effort normal and breath sounds normal. She has no wheezes. She has no rales.  Abdominal: Soft. Bowel sounds are normal. She exhibits no distension. There is no tenderness. There is no guarding. No hernia.  Musculoskeletal: Normal range of motion. She exhibits  no edema, tenderness or deformity.  Lymphadenopathy:    She has no cervical adenopathy.  Neurological: She is alert and oriented to person, place, and time. Coordination normal.  Skin: Skin is warm and dry. Capillary refill takes less than 2 seconds. No rash noted.  Psychiatric: She has a normal mood and affect. Her behavior is normal.  Nursing note and vitals reviewed.    Musculoskeletal Exam:  Full range of motion of all joints Grip strength is  equal and strong bilaterally Fiber myalgia tender points are 6 out of 18 positive Bilateral trapezius muscles are tender to palpation (the trapezius muscles themselves do not hurt but when they attach at the occiput is painful) Bilateral SI joint pain Bilateral greater trochanteric bursa pain (left is worse than the right). Patient rates her discomfort as a 4 on a scale of 0-10. She had mild swelling over her left ankle joint and tenderness. She also has tenderness over bilateral plantar fascial area.  CDAI Exam: CDAI Homunculus Exam:   Joint Counts:  CDAI Tender Joint count: 0 CDAI Swollen Joint count: 0  Global Assessments:  Patient Global Assessment: 4 Provider Global Assessment: 4  CDAI Calculated Score: 8    Investigation: No additional findings. Orders Only on 05/21/2017  Component Date Value Ref Range Status  . WBC 05/21/2017 4.8  3.8 - 10.8 K/uL Final  . RBC 05/21/2017 4.36  3.80 - 5.10 MIL/uL Final  . Hemoglobin 05/21/2017 12.3  11.7 - 15.5 g/dL Final  . HCT 05/21/2017 38.0  35.0 - 45.0 % Final  . MCV 05/21/2017 87.2  80.0 - 100.0 fL Final  . MCH 05/21/2017 28.2  27.0 - 33.0 pg Final  . MCHC 05/21/2017 32.4  32.0 - 36.0 g/dL Final  . RDW 05/21/2017 13.5  11.0 - 15.0 % Final  . Platelets 05/21/2017 221  140 - 400 K/uL Final  . MPV 05/21/2017 8.7  7.5 - 12.5 fL Final  . Neutro Abs 05/21/2017 2496  1,500 - 7,800 cells/uL Final  . Lymphs Abs 05/21/2017 1632  850 - 3,900 cells/uL Final  . Monocytes Absolute 05/21/2017 480  200 - 950 cells/uL Final  . Eosinophils Absolute 05/21/2017 192  15 - 500 cells/uL Final  . Basophils Absolute 05/21/2017 0  0 - 200 cells/uL Final  . Neutrophils Relative % 05/21/2017 52  % Final  . Lymphocytes Relative 05/21/2017 34  % Final  . Monocytes Relative 05/21/2017 10  % Final  . Eosinophils Relative 05/21/2017 4  % Final  . Basophils Relative 05/21/2017 0  % Final  . Smear Review 05/21/2017 Criteria for review not met   Final  . Sodium  05/21/2017 136  135 - 146 mmol/L Final  . Potassium 05/21/2017 4.8  3.5 - 5.3 mmol/L Final  . Chloride 05/21/2017 102  98 - 110 mmol/L Final  . CO2 05/21/2017 25  20 - 31 mmol/L Final  . Glucose, Bld 05/21/2017 83  65 - 99 mg/dL Final  . BUN 05/21/2017 18  7 - 25 mg/dL Final  . Creat 05/21/2017 0.87  0.50 - 1.10 mg/dL Final  . Total Bilirubin 05/21/2017 0.5  0.2 - 1.2 mg/dL Final  . Alkaline Phosphatase 05/21/2017 79  33 - 115 U/L Final  . AST 05/21/2017 17  10 - 35 U/L Final  . ALT 05/21/2017 14  6 - 29 U/L Final  . Total Protein 05/21/2017 7.0  6.1 - 8.1 g/dL Final  . Albumin 05/21/2017 4.2  3.6 - 5.1 g/dL Final  .  Calcium 05/21/2017 9.5  8.6 - 10.2 mg/dL Final  . GFR, Est African American 05/21/2017 >89  >=60 mL/min Final  . GFR, Est Non African American 05/21/2017 80  >=60 mL/min Final  . G-6PDH 05/21/2017 14.1  7.0 - 20.5 U/g Hgb Final   Patient brings in labs from her PCPs office. These labs were done 12/13/2016. Physician was Dr. Edman Circle at [facility]. Hemoglobin A1c normal at 5.4 CMP with GFR is normal with creatinine at 0.8, GFR at 77, AST and ALT at 19 and 15 respectively. CBC with differential is within normal limits with white count at 5.2; RBCs at 4.5; hemoglobin at 13.3; hematocrit at 39.5 Thyroid-stimulating hormone at 1.589 Cholesterol panel was also done. Please see scanned report for full details.  Imaging: Dg Ankle Complete Left  Result Date: 05/14/2017 Please see detailed radiograph report in office note.   Speciality Comments: No specialty comments available.    Procedures:  No procedures performed Allergies: Codeine   Assessment / Plan:     Visit Diagnoses: HLA B27 (HLA B27 positive): She complains of multiple arthralgias and tendinopathy. She has some swelling over her left ankle today. She's been having pain in her left ankle for a long time. We discussed the possible trial of sulfasalazine and see if it will improve her symptoms. She was  in agreement. Indications side effects contraindications were discussed at length. A handout was given consent was taken. The plan is to start her on Azulfidine EN 500 mg 2 tablets by mouth twice a day 30 day supply with 2 refills were given. We will check her lab work in one month then every 3 months to monitor for drug toxicity. I will check hep panel and TB cold-weather next blood work.  Bilateral plantar fasciitis: She has persistent ongoing plantar fasciitis.  Primary insomnia  Myofascial pain syndrome: 6 out of 18 tender points; bilateral trapezius, bilateral SI joint, bilateral greater trochanteric bursa . I think she will benefit from use of Cymbalta as to the Celexa. I've advised her to discuss that further with her PCP.   Orders: Orders Placed This Encounter  Procedures  . CBC with Differential/Platelet  . COMPLETE METABOLIC PANEL WITH GFR  . Quantiferon tb gold assay (blood)  . Hepatitis panel, acute  . CBC with Differential/Platelet  . COMPLETE METABOLIC PANEL WITH GFR   Meds ordered this encounter  Medications  . sulfaSALAzine (AZULFIDINE) 500 MG tablet    Sig: Take 2 tablets (1,000 mg total) by mouth 2 (two) times daily.    Dispense:  120 tablet    Refill:  2    Face-to-face time spent with patient was 30 minutes. 50% of time was spent in counseling and coordination of care.  Follow-Up Instructions: Return in about 3 months (around 09/07/2017) for HLA B 27 positive, arthralgias, myofascial pain syndrome.   Bo Merino, MD  Note - This record has been created using Editor, commissioning.  Chart creation errors have been sought, but may not always  have been located. Such creation errors do not reflect on  the standard of medical care.

## 2017-06-07 NOTE — Patient Instructions (Addendum)
Standing Labs We placed an order today for your standing lab work.    Please come back and get your standing labs in one month, then every three months   We have open lab Monday through Friday from 8:30-11:30 AM and 1:30-4 PM at the office of Dr. Tresa Moore, PA.   The office is located at [address], [place], [place], [place] [address] No appointment is necessary.   Labs are drawn by [facility].  You may receive a bill from [facility] for your lab work. If you have any questions regarding directions or hours of operation,  please call [phone].     Sulfasalazine delayed release tablets What is this medicine? SULFASALAZINE (sul fa SAL a zeen) is for ulcerative colitis and certain types of rheumatoid arthritis. This medicine may be used for other purposes; ask your health care provider or pharmacist if you have questions. COMMON BRAND NAME(S): Azulfidine En-Tabs, Sulfazine EC What should I tell my health care provider before I take this medicine? They need to know if you have any of these conditions: -asthma -blood disorders or anemia -glucose-6-phosphate dehydrogenase (G6PD) deficiency -intestinal obstruction -kidney disease -liver disease -porphyria -urinary tract obstruction -an unusual reaction to sulfasalazine, sulfa drugs, salicylates, other medicines, foods, dyes, or preservatives -pregnant or trying to get pregnant -breast-feeding How should I use this medicine? Take this medicine by mouth with a full glass of water. Follow the directions on the prescription label. If the medicine upsets your stomach, take it with food or milk. Do not crush or chew the tablets. Swallow the tablets whole. Take your medicine at regular intervals. Do not take your medicine more often than directed. Do not stop taking except on your doctor's advice. Talk to your pediatrician regarding the use of this medicine in children. Special care may be needed. While this drug may be  prescribed for children as young as 6 years for selected conditions, precautions do apply. Patients over 41 years old may have a stronger reaction and need a smaller dose. Overdosage: If you think you have taken too much of this medicine contact a poison control center or emergency room at once. NOTE: This medicine is only for you. Do not share this medicine with others. What if I miss a dose? If you miss a dose, take it as soon as you can. If it is almost time for your next dose, take only that dose. Do not take double or extra doses. What may interact with this medicine? -digoxin -folic acid This list may not describe all possible interactions. Give your health care provider a list of all the medicines, herbs, non-prescription drugs, or dietary supplements you use. Also tell them if you smoke, drink alcohol, or use illegal drugs. Some items may interact with your medicine. What should I watch for while using this medicine? Visit your doctor or health care professional for regular checks on your progress. You will need frequent blood and urine checks. This medicine can make you more sensitive to the sun. Keep out of the sun. If you cannot avoid being in the sun, wear protective clothing and use sunscreen. Do not use sun lamps or tanning beds/booths. Drink plenty of water while taking this medicine. Tell your doctor if you see the tablet in your stools. Your body may not be absorbing the medicine. What side effects may I notice from receiving this medicine? Side effects that you should report to your doctor or health care professional as soon as possible: -allergic reactions  like skin rash, itching or hives, swelling of the face, lips, or tongue -fever, chills, or any other sign of infection -painful, difficult, or reduced urination -redness, blistering, peeling or loosening of the skin, including inside the mouth -severe stomach pain -unusual bleeding or bruising -unusually weak or  tired -yellowing of the skin or eyes Side effects that usually do not require medical attention (report to your doctor or health care professional if they continue or are bothersome): -headache -loss of appetite -nausea, vomiting -orange color to the urine -reduced sperm count This list may not describe all possible side effects. Call your doctor for medical advice about side effects. You may report side effects to FDA at 1-800-FDA-1088. Where should I keep my medicine? Keep out of the reach of children. Store at room temperature between 15 and 30 degrees C (59 and 86 degrees F). Keep container tightly closed. Throw away any unused medicine after the expiration date. NOTE: This sheet is a summary. It may not cover all possible information. If you have questions about this medicine, talk to your doctor, pharmacist, or health care provider.  2018 Elsevier/Gold Standard (2008-07-31 13:02:26)

## 2017-06-14 ENCOUNTER — Telehealth: Payer: Self-pay | Admitting: Rheumatology

## 2017-06-14 NOTE — Telephone Encounter (Signed)
Patient called stating that she just started taking Sulfasalazine.  She stated that each time she takes the medication, she gets a headache.  She thinks that she probably needs to stop taking it.  Thank you.

## 2017-06-14 NOTE — Telephone Encounter (Signed)
Patient states she is not tolerating the SSZ well. Patient states she started the SSZ approximately 06/08/17. Patient states she started experiencing stomach pain, nausea, vomiting and severe headache on 06/11/17. Patient states she discontinued the medication last night. Please advise.

## 2017-06-14 NOTE — Telephone Encounter (Signed)
I spoke with patient. She states that at this point she would try Cymbalta which should be prescribed by her PCP. If her symptoms get worse then she will contact us.

## 2017-06-18 DIAGNOSIS — M791 Myalgia: Secondary | ICD-10-CM | POA: Diagnosis not present

## 2017-06-19 ENCOUNTER — Ambulatory Visit: Payer: 59 | Admitting: Sports Medicine

## 2017-06-20 ENCOUNTER — Telehealth: Payer: Self-pay | Admitting: Rheumatology

## 2017-06-20 NOTE — Telephone Encounter (Signed)
Patient left a message stating that she is having some foot and ankle swelling and is wanting to know if she needs to see Dr. Estanislado Pandy or her podiatrist.

## 2017-06-21 ENCOUNTER — Encounter: Payer: Self-pay | Admitting: Rheumatology

## 2017-06-21 ENCOUNTER — Ambulatory Visit (INDEPENDENT_AMBULATORY_CARE_PROVIDER_SITE_OTHER): Payer: 59 | Admitting: Rheumatology

## 2017-06-21 VITALS — BP 127/80 | HR 84 | Resp 13 | Ht 62.0 in | Wt 146.0 lb

## 2017-06-21 DIAGNOSIS — E349 Endocrine disorder, unspecified: Secondary | ICD-10-CM | POA: Diagnosis not present

## 2017-06-21 DIAGNOSIS — M791 Myalgia: Secondary | ICD-10-CM

## 2017-06-21 DIAGNOSIS — F5101 Primary insomnia: Secondary | ICD-10-CM | POA: Diagnosis not present

## 2017-06-21 DIAGNOSIS — M25472 Effusion, left ankle: Secondary | ICD-10-CM | POA: Diagnosis not present

## 2017-06-21 DIAGNOSIS — M722 Plantar fascial fibromatosis: Secondary | ICD-10-CM | POA: Diagnosis not present

## 2017-06-21 DIAGNOSIS — M7918 Myalgia, other site: Secondary | ICD-10-CM

## 2017-06-21 DIAGNOSIS — Z1589 Genetic susceptibility to other disease: Secondary | ICD-10-CM | POA: Diagnosis not present

## 2017-06-21 DIAGNOSIS — Z8659 Personal history of other mental and behavioral disorders: Secondary | ICD-10-CM | POA: Diagnosis not present

## 2017-06-21 NOTE — Telephone Encounter (Signed)
She needs to come here, anywhere we can work her in tomorrow ?

## 2017-06-21 NOTE — Progress Notes (Signed)
Office Visit Note  Patient: Lisa Mcbride             Date of Birth: 10-26-1971           MRN: [id number]             PCP: Juluis Pitch, MD Referring: Juluis Pitch, MD Visit Date: 06/21/2017 Occupation: _0 @    Subjective:  Foot Swelling (Foot/Ankle swelling x 2 weeks, worsening, no pain, no injury)   History of Present Illness: Lisa Mcbride is a 46 y.o. female with history of HLA-B27 positive arthropathy. She was tried on sulfasalazine last visit and had intolerance to the medication with nausea and abdominal bloating. She had to discontinue the medication. Now she's having increased pain and swelling in her bilateral ankles. She still have some symptoms from plantar fasciitis. None of the other joints have been painful.  Activities of Daily Living:  Patient reports morning stiffness for 0 minute.   Patient Denies nocturnal pain.  Difficulty dressing/grooming: Denies Difficulty climbing stairs: Denies Difficulty getting out of chair: Denies Difficulty using hands for taps, buttons, cutlery, and/or writing: Denies   Review of Systems  Constitutional: Positive for fatigue. Negative for night sweats, weight gain, weight loss and weakness.  HENT: Negative for mouth sores, trouble swallowing, trouble swallowing, mouth dryness and nose dryness.   Eyes: Negative for pain, redness, visual disturbance and dryness.  Respiratory: Negative for cough, shortness of breath and difficulty breathing.   Cardiovascular: Negative for chest pain, palpitations, hypertension, irregular heartbeat and swelling in legs/feet.  Gastrointestinal: Negative for blood in stool, constipation and diarrhea.  Endocrine: Negative for increased urination.  Genitourinary: Negative for vaginal dryness.  Musculoskeletal: Positive for arthralgias and joint pain. Negative for joint swelling, myalgias, muscle weakness, morning stiffness, muscle tenderness and myalgias.  Skin: Negative for color  change, rash, hair loss, skin tightness, ulcers and sensitivity to sunlight.  Allergic/Immunologic: Negative for susceptible to infections.  Neurological: Negative for dizziness, memory loss and night sweats.  Hematological: Negative for swollen glands.  Psychiatric/Behavioral: Positive for depressed mood and sleep disturbance. The patient is not nervous/anxious.     PMFS History:  Patient Active Problem List   Diagnosis Date Noted  . HLA B27 (HLA B27 positive) 03/06/2017  . Bilateral plantar fasciitis 03/06/2017  . Primary insomnia 03/06/2017  . History of depression 03/06/2017    History reviewed. No pertinent past medical history.  History reviewed. No pertinent family history. Past Surgical History:  Procedure Laterality Date  . ABLATION ON ENDOMETRIOSIS  2014  . TONSILLECTOMY     Social History   Social History Narrative  . No narrative on file     Objective: Vital Signs: BP 127/80 (BP Location: Left Arm, Patient Position: Sitting, Cuff Size: Normal)   Pulse 84   Resp 13   Ht _1  (1.575 m)   Wt 146 lb (66.2 kg)   BMI 26.70 kg/m    Physical Exam  Constitutional: She is oriented to person, place, and time. She appears well-developed and well-nourished.  HENT:  Head: Normocephalic and atraumatic.  Eyes: Conjunctivae and EOM are normal.  Neck: Normal range of motion.  Cardiovascular: Normal rate, regular rhythm, normal heart sounds and intact distal pulses.   Pulmonary/Chest: Effort normal and breath sounds normal.  Abdominal: Soft. Bowel sounds are normal.  Lymphadenopathy:    She has no cervical adenopathy.  Neurological: She is alert and oriented to person, place, and time.  Skin: Skin is warm and dry. Capillary refill  takes less than 2 seconds.  Psychiatric: She has a normal mood and affect. Her behavior is normal.  Nursing note and vitals reviewed.    Musculoskeletal Exam: C-spine and thoracic lumbar spine good range of motion. Shoulder joints although  joints wrist joint MCPs PIPs DIPs with good range of motion with no synovitis. Joints knee joints are good range of motion. She is some tenderness on palpation of bilateral ankle joints. And tenderness over plantar fascia.  CDAI Exam: No CDAI exam completed.    Investigation: No additional findings.   Imaging: No results found.  Speciality Comments: No specialty comments available.    Procedures:  No procedures performed Allergies: Codeine   Assessment / Plan:     Visit Diagnoses: Bilateral plantar fasciitis: Patient is persistent plantar fasciitis.  Bilateral ankle joint pain with increase ankle joint discomfort in her left ankle. She is tenderness on palpation over her left ankle. I could not appreciate much swelling on examination. They've given her a trial of sulfasalazine which she could not tolerate. I will schedule MRI of her left ankle joint to rule out synovitis.  HLA B27 (HLA B27 positive)  Myofascial pain: Patient is recently started Cymbalta. We will see response to that.  Primary insomnia  History of depression    Orders: No orders of the defined types were placed in this encounter.  No orders of the defined types were placed in this encounter.   Face-to-face time spent with patient was 20 minutes. 50% of time was spent in counseling and coordination of care.  Follow-Up Instructions: No Follow-up on file.   Bo Merino, MD  Note - This record has been created using Editor, commissioning.  Chart creation errors have been sought, but may not always  have been located. Such creation errors do not reflect on  the standard of medical care.

## 2017-06-21 NOTE — Telephone Encounter (Signed)
She is coming in at 8am tomorrow as a workin

## 2017-06-21 NOTE — Telephone Encounter (Signed)
Left message for patient to call me back about work in appt tomorrow.

## 2017-06-21 NOTE — Telephone Encounter (Signed)
Thank you I have called patient to advise she can come in tomorrow at 8am, have asked Katharine Look to put her on the schedule.

## 2017-06-21 NOTE — Addendum Note (Signed)
Addended by: Shona Needles on: 06/21/2017 04:26 PM   Modules accepted: Orders

## 2017-06-21 NOTE — Telephone Encounter (Signed)
The only time we have is 8:00a.m. Tomorrow morning.

## 2017-06-22 ENCOUNTER — Ambulatory Visit: Payer: 59 | Admitting: Rheumatology

## 2017-06-28 ENCOUNTER — Ambulatory Visit (HOSPITAL_COMMUNITY): Admission: RE | Admit: 2017-06-28 | Payer: 59 | Source: Ambulatory Visit

## 2017-08-17 ENCOUNTER — Telehealth: Payer: Self-pay | Admitting: Rheumatology

## 2017-08-17 NOTE — Telephone Encounter (Signed)
Patient has a schedule appt 10/9. Patient questioning if she needs to keep this appt since she has dc'd the medicine doctor put her on. Please call to advise.

## 2017-08-17 NOTE — Telephone Encounter (Signed)
She has history of HLA-B27 positive arthropathy. She was tried on sulfasalazine last visit and had intolerance to the medication with nausea and abdominal bloating. She had to discontinue the medication. Now she's having increased pain and swelling in her bilateral ankles.   You tried her on Cymbalta She states she d/c and is now calling asking if she needs to come in for follow up in October  Okay to cancel follow up have her come in prn?

## 2017-08-18 NOTE — Telephone Encounter (Signed)
Ok to return prn

## 2017-08-20 NOTE — Telephone Encounter (Signed)
Patient advised she can follow up on prn basis.

## 2017-08-20 NOTE — Telephone Encounter (Signed)
Attempted to contact the patient and left message for patient to call the office.  

## 2017-09-06 DIAGNOSIS — E349 Endocrine disorder, unspecified: Secondary | ICD-10-CM | POA: Diagnosis not present

## 2017-09-06 DIAGNOSIS — Z23 Encounter for immunization: Secondary | ICD-10-CM | POA: Diagnosis not present

## 2017-09-10 NOTE — Progress Notes (Deleted)
Office Visit Note  Patient: Lisa Mcbride             Date of Birth: 1971/05/12           MRN: [id number]             PCP: Juluis Pitch, MD Referring: Juluis Pitch, MD Visit Date: 09/18/2017 Occupation: '@GUAROCC' @    Subjective:  No chief complaint on file.   History of Present Illness: Lisa Mcbride is a 46 y.o. female ***   Activities of Daily Living:  Patient reports morning stiffness for *** {minute/hour:19697}.   Patient {ACTIONS;DENIES/REPORTS:21021675::"Denies"} nocturnal pain.  Difficulty dressing/grooming: {ACTIONS;DENIES/REPORTS:21021675::"Denies"} Difficulty climbing stairs: {ACTIONS;DENIES/REPORTS:21021675::"Denies"} Difficulty getting out of chair: {ACTIONS;DENIES/REPORTS:21021675::"Denies"} Difficulty using hands for taps, buttons, cutlery, and/or writing: {ACTIONS;DENIES/REPORTS:21021675::"Denies"}   No Rheumatology ROS completed.   PMFS History:  Patient Active Problem List   Diagnosis Date Noted  . HLA B27 (HLA B27 positive) 03/06/2017  . Bilateral plantar fasciitis 03/06/2017  . Primary insomnia 03/06/2017  . History of depression 03/06/2017    No past medical history on file.  No family history on file. Past Surgical History:  Procedure Laterality Date  . ABLATION ON ENDOMETRIOSIS  2014  . TONSILLECTOMY     Social History   Social History Narrative  . No narrative on file     Objective: Vital Signs: There were no vitals taken for this visit.   Physical Exam   Musculoskeletal Exam: ***  CDAI Exam: No CDAI exam completed.    Investigation: Findings:  Labs from July 17, 2016, show CMP with GFR is normal.  Urinalysis is normal.  Hepatitis panel is negative.  TB Gold is negative.  Angiotensin-1 converting enzyme is negative.  SPEP is negative.  CCP is negative.  Note that on May 19, 2016, her CBC was normal.  BMP was normal.  CRP was normal.  Sed rate normal.  Uric acid at 3.7.  Rheumatoid factor negative.  HLA-B27 was  positive.    07/17/2016 X-rays of bilateral feet, 2 views today, showed bilateral MTP, PIP, and DIP were normal.  There was a large left calcaneal spur.  Bilateral hand x-rays, 2 views, were within normal limits without any joint space narrowing or erosive changes.  A pelvis x-ray was also unremarkable.  Lumbar spine x-ray did not show any syndesmophytes or squaring of vertebrae.  CBC Latest Ref Rng & Units 05/21/2017 05/12/2016 07/16/2013  WBC 3.8 - 10.8 K/uL 4.8 4.8 6.3  Hemoglobin 11.7 - 15.5 g/dL 12.3 13.2 12.3  Hematocrit 35.0 - 45.0 % 38.0 39.1 34.2(L)  Platelets 140 - 400 K/uL 221 235 262   CMP Latest Ref Rng & Units 05/21/2017 05/12/2016  Glucose 65 - 99 mg/dL 83 81  BUN 7 - 25 mg/dL 18 21  Creatinine 0.50 - 1.10 mg/dL 0.87 0.81  Sodium 135 - 146 mmol/L 136 140  Potassium 3.5 - 5.3 mmol/L 4.8 5.1  Chloride 98 - 110 mmol/L 102 99  CO2 20 - 31 mmol/L 25 24  Calcium 8.6 - 10.2 mg/dL 9.5 10.1  Total Protein 6.1 - 8.1 g/dL 7.0 -  Total Bilirubin 0.2 - 1.2 mg/dL 0.5 -  Alkaline Phos 33 - 115 U/L 79 -  AST 10 - 35 U/L 17 -  ALT 6 - 29 U/L 14 -   Imaging: No results found.  Speciality Comments: No specialty comments available.    Procedures:  No procedures performed Allergies: Codeine   Assessment / Plan:     Visit Diagnoses:  HLA B27 (HLA B27 positive) - 07/21/2016 MRI SI joints negative for sacroiliitis  Bilateral plantar fasciitis  Primary osteoarthritis of both feet  Primary insomnia  History of depression    Orders: No orders of the defined types were placed in this encounter.  No orders of the defined types were placed in this encounter.   Face-to-face time spent with patient was *** minutes. 50% of time was spent in counseling and coordination of care.  Follow-Up Instructions: No Follow-up on file.   Dylen Mcelhannon, RT  Note - This record has been created using [employer].  Chart creation errors have been sought, but may not always  have been located.  Such creation errors do not reflect on  the standard of medical care.

## 2017-09-18 ENCOUNTER — Ambulatory Visit: Payer: 59 | Admitting: Rheumatology

## 2017-09-26 DIAGNOSIS — R110 Nausea: Secondary | ICD-10-CM | POA: Diagnosis not present

## 2017-09-26 DIAGNOSIS — Z23 Encounter for immunization: Secondary | ICD-10-CM | POA: Diagnosis not present

## 2017-11-05 ENCOUNTER — Other Ambulatory Visit: Payer: Self-pay | Admitting: Rheumatology

## 2017-11-05 NOTE — Telephone Encounter (Signed)
Last Visit: 06/21/17 Next visit: 12/19/17 Labs: 05/21/17 WNL  Okay to refill per Dr. Estanislado Pandy

## 2017-11-06 ENCOUNTER — Telehealth: Payer: Self-pay | Admitting: *Deleted

## 2017-11-06 NOTE — Telephone Encounter (Signed)
Pt states she is having pain in the dorsal and side of her feet and would like to know if she is to see Dr. Cannon Kettle again or go to the rheumatologist.

## 2017-11-06 NOTE — Telephone Encounter (Signed)
Either myself or her rheumatologist should be able to help her with the pain to the tops of both her feet. Have her schedule an appt with whomever she prefers for an evaluation -Dr. Cannon Kettle

## 2017-11-07 NOTE — Telephone Encounter (Signed)
I informed pt of Dr. Leeanne Rio recommendations and she stated she felt like the rheumatologist really had been able to help her. I told her I would have the schedulers call her tomorrow or she could call first thing in the morning. Pt states understanding.

## 2017-11-16 DIAGNOSIS — Z1231 Encounter for screening mammogram for malignant neoplasm of breast: Secondary | ICD-10-CM | POA: Diagnosis not present

## 2017-11-22 ENCOUNTER — Other Ambulatory Visit: Payer: Self-pay | Admitting: Family Medicine

## 2017-11-22 DIAGNOSIS — Z1239 Encounter for other screening for malignant neoplasm of breast: Secondary | ICD-10-CM

## 2017-11-28 ENCOUNTER — Encounter: Payer: Self-pay | Admitting: Sports Medicine

## 2017-11-28 ENCOUNTER — Ambulatory Visit (INDEPENDENT_AMBULATORY_CARE_PROVIDER_SITE_OTHER): Payer: 59 | Admitting: Sports Medicine

## 2017-11-28 ENCOUNTER — Ambulatory Visit (INDEPENDENT_AMBULATORY_CARE_PROVIDER_SITE_OTHER): Payer: 59

## 2017-11-28 ENCOUNTER — Telehealth: Payer: Self-pay | Admitting: *Deleted

## 2017-11-28 ENCOUNTER — Ambulatory Visit: Payer: Self-pay | Admitting: Certified Nurse Midwife

## 2017-11-28 DIAGNOSIS — M722 Plantar fascial fibromatosis: Secondary | ICD-10-CM

## 2017-11-28 DIAGNOSIS — M779 Enthesopathy, unspecified: Principal | ICD-10-CM

## 2017-11-28 DIAGNOSIS — M79671 Pain in right foot: Secondary | ICD-10-CM

## 2017-11-28 DIAGNOSIS — M659 Synovitis and tenosynovitis, unspecified: Secondary | ICD-10-CM

## 2017-11-28 DIAGNOSIS — M7662 Achilles tendinitis, left leg: Secondary | ICD-10-CM | POA: Diagnosis not present

## 2017-11-28 DIAGNOSIS — M778 Other enthesopathies, not elsewhere classified: Secondary | ICD-10-CM

## 2017-11-28 DIAGNOSIS — M7750 Other enthesopathy of unspecified foot: Secondary | ICD-10-CM

## 2017-11-28 DIAGNOSIS — M79672 Pain in left foot: Secondary | ICD-10-CM

## 2017-11-28 DIAGNOSIS — M7661 Achilles tendinitis, right leg: Secondary | ICD-10-CM | POA: Diagnosis not present

## 2017-11-28 MED ORDER — PREDNISONE 5 MG PO TBEC: DELAYED_RELEASE_TABLET | ORAL | 0 refills | Status: DC

## 2017-11-28 NOTE — Telephone Encounter (Addendum)
-----   Message from [place], [place] sent at 11/28/2017 10:16 AM EST ----- Regarding: MRI Right and Left foot  Pain at lateral foot on right along peroneal tendon and diffuse pain at tops of both feet. Patient wants MRIs before end of year Thanks Dr. Chauncey Cruel.

## 2017-11-28 NOTE — Progress Notes (Signed)
Patient ID: MATTELYN IMHOFF, female   DOB: 1971-01-02, 46 y.o.   MRN: [id number] Subjective: Lisa Mcbride is a 46 y.o. female patient returns for right greater than left foot pain.  Patient reports that her feet feel like they are being crushed at the end of the day states that this pain is different from her other typical foot pain that she has had in the past.  States that when she is lying in bed and attempts to turn on her right side any pressure to the side of her right foot is very excruciating patient states that during the day her pain is about 2 out of 10 however this amplifies at night.  Patient states that this pain has been going on for about 3-4 months and does recall getting new insoles that actually made her pain worse states that since she has stopped wearing those new insoles the pain has calmed down however still very excruciating at night. Patient does admit to still have ongoing chronic plantar fasciitis and Achilles tendinitis, which limits her in her activity sometime. Denies any other pedal complaints.  Denies nausea vomiting fever chills night sweats chest pain or any other acute symptoms.  Patient states that she did not tolerate medications given by rheumatologist well and experienced some of the symptoms were however since stopping medications has not had any of the symptoms at all.  Admits that her rheumatologist ordered a MRI of her left foot and ankle that she never got because her husband had to have surgery.  Patient Active Problem List   Diagnosis Date Noted  . HLA B27 (HLA B27 positive) 03/06/2017  . Bilateral plantar fasciitis 03/06/2017  . Primary insomnia 03/06/2017  . History of depression 03/06/2017   Current Outpatient Medications on File Prior to Visit  Medication Sig Dispense Refill  . Cholecalciferol (VITAMIN D3) 1000 UNITS CAPS Take by mouth.    . citalopram (CELEXA) 20 MG tablet   12  . diazepam (VALIUM) 5 MG tablet Take by mouth.    .  DULoxetine (CYMBALTA) 30 MG capsule Take by mouth.    . meloxicam (MOBIC) 15 MG tablet TAKE 1 TABLET BY MOUTH DAILY AS NEEDED FOR PAIN. 30 tablet 2  . Omega-3 Fatty Acids (FISH OIL) 1000 MG CAPS Take by mouth.    . sulfaSALAzine (AZULFIDINE) 500 MG tablet Take 2 tablets (1,000 mg total) by mouth 2 (two) times daily. (Patient not taking: Reported on 06/21/2017) 120 tablet 2  . traZODone (DESYREL) 100 MG tablet TAKE 1 TABLET BY MOUTH NIGHTLY.     No current facility-administered medications on file prior to visit.    Allergies  Allergen Reactions  . Codeine Nausea And Vomiting    Also nausea.     Objective: Physical Exam General: The patient is alert and oriented x3 in no acute distress.  Dermatology: Skin is warm, dry and supple bilateral lower extremities. Nails 1-10 are normal. There is no erythema, edema, no eccymosis, no open lesions present. Integument is otherwise unremarkable.  Vascular: Dorsalis Pedis pulse and Posterior Tibial pulse are 2/4 bilateral. Capillary fill time is immediate to all digits.  Minimal swelling bilateral however subjectively patient does notice a little bit more swelling on her right upon closer evaluation there may be negotiable increased swelling over the muscle belly of the extensor digitorum brevis otherwise no other obvious areas of swelling.  Neurological: Grossly intact to light touch with an achilles reflex of +2/5 and a negative Tinel's sign  bilateral.  Musculoskeletal: There is diffuse tenderness to the dorsal midfoot bilateral and along the lateral column peroneal tendon course right foot. Unchanged tenderness to palpation at the medial calcaneal tubercale and through the insertion of the plantar fascia and achilles bilateral feet. No tenderness to right or left 5th MTPJ/tailors bunion deformity. No pain with calf compression bilateral. There is mild decreased Ankle joint range of motion bilateral. All other joints range of motion within normal limits  bilateral. Strength 5/5 in all groups bilateral.   X-rays normal osseous mineralization reveals no acute pathology no fracture no dislocation chronic heel spurs soft tissue margins within normal limits   Assessment and Plan: Problem List Items Addressed This Visit    None    Visit Diagnoses    Synovitis and tenosynovitis    -  Primary   Relevant Medications   PredniSONE (RAYOS) 5 MG TBEC   Extensor tendonitis of foot       Relevant Orders   DG Foot Complete Right (Completed)   DG Foot Complete Left (Completed)   Plantar fasciitis, bilateral       Achilles tendonitis, bilateral       Foot pain, bilateral         -Complete examination performed -X-rays reviewed -Discussed long term care for tendinitis, fasciitis, and possible synovitis secondary to arthropathy -Rx Rayos delayed release prednisone to see if this will help with her peak of symptoms at night as far as her foot pain -Advised patient that she may also get over-the-counter pain patch to wear to bed at night to help -Advised patient to continue with supportive therapy and that she may after 1 week try to wear her shoes without her insoles to see if she notices a difference -Cont with Mobic as given by Dr. Dora Sims as needed -Recommend PRICE protocol  -Ordered MRIs right and left foot for further evaluation to evaluate for any possible synovitis or acute issues that may need to be addressed since pain is chronic and ongoing -Patient to return to office after MRIs or sooner if problems or issues arise.   Landis Martins, DPM

## 2017-11-29 NOTE — Telephone Encounter (Addendum)
[facility] scheduled 12/05/2017 2:15pm to arrive 2:00pm. I informed pt of the scheduled MRIs appt and gave [phone] to reschedule if needed.

## 2017-12-06 ENCOUNTER — Ambulatory Visit: Payer: Self-pay | Admitting: Certified Nurse Midwife

## 2017-12-06 DIAGNOSIS — M79672 Pain in left foot: Secondary | ICD-10-CM | POA: Diagnosis not present

## 2017-12-06 DIAGNOSIS — S86312A Strain of muscle(s) and tendon(s) of peroneal muscle group at lower leg level, left leg, initial encounter: Secondary | ICD-10-CM | POA: Diagnosis not present

## 2017-12-06 DIAGNOSIS — Z1231 Encounter for screening mammogram for malignant neoplasm of breast: Secondary | ICD-10-CM | POA: Diagnosis not present

## 2017-12-07 ENCOUNTER — Telehealth: Payer: Self-pay | Admitting: Sports Medicine

## 2017-12-07 NOTE — Telephone Encounter (Signed)
I spoke with pt and Dr. Cannon Kettle said she was to take it in the morning and would have leftovers but the instructions state take during the night. I told pt I would send message to Dr. Cannon Kettle.

## 2017-12-07 NOTE — Telephone Encounter (Signed)
Dr. Cannon Kettle recently prescribed me a new medication which I just received by mail. I think Dr. Cannon Kettle gave me some different instructions that were different than what was printed on the label and I cannot remember what they were and I can't find them in my chart. So I think she wanted me to take them in the morning. So could somebody please look at that and give me a call so I can get started on that medication. You can call me on my cell at [phone]. Thank you.

## 2017-12-11 NOTE — Telephone Encounter (Signed)
Take as instructed in a tapered dose fashion. The Rayos is slow release and is ideal to help with peak of inflammation at end of day/night. She will have some left over because I Rx 30 instead of 21 tablets Thanks Dr. Chauncey Cruel

## 2017-12-12 NOTE — Telephone Encounter (Signed)
I informed pt of Dr. Leeanne Rio orders of 12/11/2017 10:52pm to take Rayos as a tapering dose with some to be left over. Pt asked for the results of the MRIs and I told her I would send a request to [facility] to fax results to [place] office. Faxed request for MRI results to be faxed to the [facility] office [phone].

## 2017-12-13 ENCOUNTER — Encounter: Payer: Self-pay | Admitting: Sports Medicine

## 2017-12-13 ENCOUNTER — Telehealth: Payer: Self-pay | Admitting: *Deleted

## 2017-12-13 NOTE — Telephone Encounter (Signed)
I informed pt of Dr. Leeanne Rio review of the MRI Results. Pt states understanding.

## 2017-12-13 NOTE — Telephone Encounter (Signed)
Left message informing pt Dr. Cannon Kettle had reviewed the report and the MRI films, and if she would like we could send for an overread, to please call to discuss.

## 2017-12-13 NOTE — Telephone Encounter (Signed)
I reviewed both the report and image. If she would like we can send her MRI out for over-read as well. Thanks Dr. Chauncey Cruel

## 2017-12-13 NOTE — Telephone Encounter (Signed)
-----   Message from Landis Martins, [place] sent at 12/12/2017  5:48 PM EST ----- Regarding: MRI results Hi  Val can you make sure patient knows that her MRI is negative. Does not show anything acute, no fracture, no tear, no extreme joint inflammation or synovitis. I would recommend to finish her Rayos medication if she hasn't already and to continue with rest, ice, elevation, good supportive shoes. If her pain continues she may want to see if her rheumatologist has anything else they would recommend since theres nothing acute/new on my end.  Thanks Dr. Chauncey Cruel

## 2017-12-14 NOTE — Telephone Encounter (Signed)
Thanks -Dr S 

## 2017-12-14 NOTE — Telephone Encounter (Signed)
I told pt I had mistakenly informed her that Dr.Stover had only reviewed the MRI report, when Dr. Cannon Kettle had reviewed the report and the films, and I asked if pt would like to have our office send her MRI disc for review by radiology specialist. Pt states no she just felt better knowing Dr. Cannon Kettle had reviewed and there were two sets of professional eyes reviewing the MRI films. Pt asked if Dr. Annamaria Helling would be able to review the MRI films and I told her she would be able if she was in the Epic system.

## 2018-01-16 ENCOUNTER — Ambulatory Visit: Payer: Self-pay | Admitting: Obstetrics and Gynecology

## 2018-01-22 ENCOUNTER — Encounter: Payer: Self-pay | Admitting: Obstetrics and Gynecology

## 2018-01-22 ENCOUNTER — Ambulatory Visit (INDEPENDENT_AMBULATORY_CARE_PROVIDER_SITE_OTHER): Payer: 59 | Admitting: Obstetrics and Gynecology

## 2018-01-22 VITALS — BP 116/76 | Ht 62.0 in | Wt 143.0 lb

## 2018-01-22 DIAGNOSIS — Z01419 Encounter for gynecological examination (general) (routine) without abnormal findings: Secondary | ICD-10-CM | POA: Diagnosis not present

## 2018-01-22 DIAGNOSIS — Z800 Family history of malignant neoplasm of digestive organs: Secondary | ICD-10-CM | POA: Diagnosis not present

## 2018-01-22 DIAGNOSIS — Z1231 Encounter for screening mammogram for malignant neoplasm of breast: Secondary | ICD-10-CM

## 2018-01-22 DIAGNOSIS — Z1239 Encounter for other screening for malignant neoplasm of breast: Secondary | ICD-10-CM

## 2018-01-22 DIAGNOSIS — N941 Unspecified dyspareunia: Secondary | ICD-10-CM

## 2018-01-22 DIAGNOSIS — Z803 Family history of malignant neoplasm of breast: Secondary | ICD-10-CM

## 2018-01-22 NOTE — Patient Instructions (Addendum)
I value your feedback and entrusting us with your care. If you get a [facility] patient survey, I would appreciate you taking the time to let us know about your experience today. Thank you!  [facility]: [phone]  [facility]: [phone]  

## 2018-01-22 NOTE — Progress Notes (Signed)
PCP:  Juluis Pitch, MD   Chief Complaint  Patient presents with  . Gynecologic Exam     HPI:      [name] is a 47 y.o. G0P0000 who LMP was No LMP recorded. Patient has had an ablation., presents today for her annual examination.  Her menses are absent due to endometrial ablation. Dysmenorrhea none. She does not have intermenstrual bleeding.  Sex activity: single partner, contraception - vasectomy. She has vaginal dryness/burning during sex. No vag sx otherwise. Has tried lubricants and coconut oil without relief. Even tried vag estrace crm last yr without sx change. Pt uses dryer sheets and random soap.   Last Pap: November 24, 2016  Results were: no abnormalities /neg HPV DNA  Hx of STDs: none  Last mammogram: 1/19 at [facility], done through PCP.  Results were: normal--routine follow-up in 12 months There is a FH of breast cancer in her mat aunt. FH of melanoma in her mom and sister, and pancreatic cancer in her mom, who is now deceased. Pt is MyRisk neg. IBIS=16.2%. There is no FH of ovarian cancer. The patient does do self-breast exams.  Tobacco use: The patient denies current or previous tobacco use. Alcohol use: social drinker No drug use.  Exercise: moderately active  She does get adequate calcium and Vitamin D in her diet.  Labs with PCP. Depression meds with PCP.  Past Medical History:  Diagnosis Date  . BRCA negative 03/2015   MyRisk neg  . Depression   . Family history of breast cancer    IBIS=16%  . Hot flashes, menopausal   . Insomnia   . Menorrhagia     Past Surgical History:  Procedure Laterality Date  . ABLATION ON ENDOMETRIOSIS  2014  . COLONOSCOPY  10/2013   negative; repeat in 5 years  . DILATION AND CURETTAGE OF UTERUS    . ENDOMETRIAL ABLATION    . TONSILLECTOMY      Family History  Problem Relation Age of Onset  . Melanoma Mother   . Pancreatic cancer Mother 61  . Melanoma Sister   . Breast cancer Maternal Aunt 30     Social History   Socioeconomic History  . Marital status: Married    Spouse name: Not on file  . Number of children: Not on file  . Years of education: Not on file  . Highest education level: Not on file  Social Needs  . Financial resource strain: Not on file  . Food insecurity - worry: Not on file  . Food insecurity - inability: Not on file  . Transportation needs - medical: Not on file  . Transportation needs - non-medical: Not on file  Occupational History  . Not on file  Tobacco Use  . Smoking status: Never Smoker  . Smokeless tobacco: Never Used  Substance and Sexual Activity  . Alcohol use: Yes    Alcohol/week: 0.6 oz    Types: 1 Cans of beer per week  . Drug use: No  . Sexual activity: Yes    Birth control/protection: Other-see comments    Comment: ablation  Other Topics Concern  . Not on file  Social History Narrative  . Not on file    Current Meds  Medication Sig  . Cholecalciferol (VITAMIN D3) 1000 units CAPS Take by mouth.  . citalopram (CELEXA) 20 MG tablet Take 1/2 tablet PO daily  . meloxicam (MOBIC) 15 MG tablet TAKE 1 TABLET BY MOUTH DAILY AS NEEDED FOR PAIN.  Marland Kitchen  traZODone (DESYREL) 100 MG tablet Take 1.5 tablets po nightly PRN  . venlafaxine (EFFEXOR) 75 MG tablet Take 1/2 tablet PO daily     ROS:  Review of Systems  Constitutional: Negative for fatigue, fever and unexpected weight change.  Respiratory: Negative for cough, shortness of breath and wheezing.   Cardiovascular: Negative for chest pain, palpitations and leg swelling.  Gastrointestinal: Negative for blood in stool, constipation, diarrhea, nausea and vomiting.  Endocrine: Negative for cold intolerance, heat intolerance and polyuria.  Genitourinary: Positive for dyspareunia. Negative for dysuria, flank pain, frequency, genital sores, hematuria, menstrual problem, pelvic pain, urgency, vaginal bleeding, vaginal discharge and vaginal pain.  Musculoskeletal: Positive for arthralgias.  Negative for back pain, joint swelling and myalgias.  Skin: Negative for rash.  Neurological: Negative for dizziness, syncope, light-headedness, numbness and headaches.  Hematological: Negative for adenopathy.  Psychiatric/Behavioral: Positive for dysphoric mood. Negative for agitation, confusion, sleep disturbance and suicidal ideas. The patient is not nervous/anxious.     Objective: BP 116/76   Ht _0  (1.575 m)   Wt 143 lb (64.9 kg)   BMI 26.16 kg/m    Physical Exam  Constitutional: She is oriented to person, place, and time. She appears well-developed and well-nourished.  Genitourinary: Vagina normal and uterus normal. There is no rash or tenderness on the right labia. There is no rash or tenderness on the left labia. No erythema or tenderness in the vagina. No vaginal discharge found. Right adnexum does not display mass and does not display tenderness. Left adnexum does not display mass and does not display tenderness. Cervix does not exhibit motion tenderness or polyp. Uterus is not enlarged or tender.  Genitourinary Comments: WELL ESTROGENIZED VULVA  Neck: Normal range of motion. No thyromegaly present.  Cardiovascular: Normal rate, regular rhythm and normal heart sounds.  No murmur heard. Pulmonary/Chest: Effort normal and breath sounds normal. Right breast exhibits no mass, no nipple discharge, no skin change and no tenderness. Left breast exhibits no mass, no nipple discharge, no skin change and no tenderness.  Abdominal: Soft. There is no tenderness. There is no guarding.  Musculoskeletal: Normal range of motion.  Neurological: She is alert and oriented to person, place, and time. No cranial nerve deficit.  Psychiatric: She has a normal mood and affect. Her behavior is normal.  Vitals reviewed.  Assessment/Plan: Encounter for annual routine gynecological examination  Screening for breast cancer - Pt current on mammos with PCP. - Plan: MM SCREENING BREAST TOMO  BILATERAL  Family history of breast cancer - IBIS=16%. - Plan: MM SCREENING BREAST TOMO BILATERAL  Family history of pancreatic cancer - Pt is MyRisk neg. STRONGLY encouraged genetic testing of affected aunt or both sisters. Handout given. Nothing further do to for pt. F/u prn.  Dyspareunia in female - Vaginal burning. No relief with estrace crm/lubricants. Question chem. Dove sensitive skin for she and husband. Line dry underwear. F/u prn.            GYN counsel breast self exam, mammography screening, adequate intake of calcium and vitamin D, diet and exercise     F/U  Return in about 1 year (around 01/22/2019).  Alicia B. Copland, PA-C 01/22/2018 9:08 AM

## 2019-01-15 DIAGNOSIS — D239 Other benign neoplasm of skin, unspecified: Secondary | ICD-10-CM | POA: Diagnosis not present

## 2019-01-15 DIAGNOSIS — L710 Perioral dermatitis: Secondary | ICD-10-CM | POA: Diagnosis not present

## 2019-08-13 ENCOUNTER — Other Ambulatory Visit (HOSPITAL_COMMUNITY)
Admission: RE | Admit: 2019-08-13 | Discharge: 2019-08-13 | Disposition: A | Discharge status: Home / Self Care | Payer: 59 | Source: Ambulatory Visit | Attending: Advanced Practice Midwife | Admitting: Advanced Practice Midwife

## 2019-08-13 ENCOUNTER — Encounter: Payer: Self-pay | Admitting: Advanced Practice Midwife

## 2019-08-13 ENCOUNTER — Ambulatory Visit (INDEPENDENT_AMBULATORY_CARE_PROVIDER_SITE_OTHER): Payer: 59 | Admitting: Advanced Practice Midwife

## 2019-08-13 ENCOUNTER — Other Ambulatory Visit: Payer: Self-pay

## 2019-08-13 VITALS — BP 118/70 | Ht 62.0 in | Wt 136.0 lb

## 2019-08-13 DIAGNOSIS — Z01419 Encounter for gynecological examination (general) (routine) without abnormal findings: Secondary | ICD-10-CM | POA: Insufficient documentation

## 2019-08-13 DIAGNOSIS — Z124 Encounter for screening for malignant neoplasm of cervix: Secondary | ICD-10-CM | POA: Insufficient documentation

## 2019-08-13 DIAGNOSIS — Z1239 Encounter for other screening for malignant neoplasm of breast: Secondary | ICD-10-CM

## 2019-08-13 NOTE — Patient Instructions (Signed)
Health Maintenance, Female Adopting a healthy lifestyle and getting preventive care are important in promoting health and wellness. Ask your health care provider about:  The right schedule for you to have regular tests and exams.  Things you can do on your own to prevent diseases and keep yourself healthy. What should I know about diet, weight, and exercise? Eat a healthy diet   Eat a diet that includes plenty of vegetables, fruits, low-fat dairy products, and lean protein.  Do not eat a lot of foods that are high in solid fats, added sugars, or sodium. Maintain a healthy weight Body mass index (BMI) is used to identify weight problems. It estimates body fat based on height and weight. Your health care provider can help determine your BMI and help you achieve or maintain a healthy weight. Get regular exercise Get regular exercise. This is one of the most important things you can do for your health. Most adults should:  Exercise for at least 150 minutes each week. The exercise should increase your heart rate and make you sweat (moderate-intensity exercise).  Do strengthening exercises at least twice a week. This is in addition to the moderate-intensity exercise.  Spend less time sitting. Even light physical activity can be beneficial. Watch cholesterol and blood lipids Have your blood tested for lipids and cholesterol at 49 years of age, then have this test every 5 years. Have your cholesterol levels checked more often if:  Your lipid or cholesterol levels are high.  You are older than 49 years of age.  You are at high risk for heart disease. What should I know about cancer screening? Depending on your health history and family history, you may need to have cancer screening at various ages. This may include screening for:  Breast cancer.  Cervical cancer.  Colorectal cancer.  Skin cancer.  Lung cancer. What should I know about heart disease, diabetes, and high blood  pressure? Blood pressure and heart disease  High blood pressure causes heart disease and increases the risk of stroke. This is more likely to develop in people who have high blood pressure readings, are of African descent, or are overweight.  Have your blood pressure checked: ? Every 3-5 years if you are 18-39 years of age. ? Every year if you are 40 years old or older. Diabetes Have regular diabetes screenings. This checks your fasting blood sugar level. Have the screening done:  Once every three years after age 40 if you are at a normal weight and have a low risk for diabetes.  More often and at a younger age if you are overweight or have a high risk for diabetes. What should I know about preventing infection? Hepatitis B If you have a higher risk for hepatitis B, you should be screened for this virus. Talk with your health care provider to find out if you are at risk for hepatitis B infection. Hepatitis C Testing is recommended for:  Everyone born from 1945 through 1965.  Anyone with known risk factors for hepatitis C. Sexually transmitted infections (STIs)  Get screened for STIs, including gonorrhea and chlamydia, if: ? You are sexually active and are younger than 49 years of age. ? You are older than 49 years of age and your health care provider tells you that you are at risk for this type of infection. ? Your sexual activity has changed since you were last screened, and you are at increased risk for chlamydia or gonorrhea. Ask your health care provider if   you are at risk.  Ask your health care provider about whether you are at high risk for HIV. Your health care provider may recommend a prescription medicine to help prevent HIV infection. If you choose to take medicine to prevent HIV, you should first get tested for HIV. You should then be tested every 3 months for as long as you are taking the medicine. Pregnancy  If you are about to stop having your period (premenopausal) and  you may become pregnant, seek counseling before you get pregnant.  Take 400 to 800 micrograms (mcg) of folic acid every day if you become pregnant.  Ask for birth control (contraception) if you want to prevent pregnancy. Osteoporosis and menopause Osteoporosis is a disease in which the bones lose minerals and strength with aging. This can result in bone fractures. If you are 65 years old or older, or if you are at risk for osteoporosis and fractures, ask your health care provider if you should:  Be screened for bone loss.  Take a calcium or vitamin D supplement to lower your risk of fractures.  Be given hormone replacement therapy (HRT) to treat symptoms of menopause. Follow these instructions at home: Lifestyle  Do not use any products that contain nicotine or tobacco, such as cigarettes, e-cigarettes, and chewing tobacco. If you need help quitting, ask your health care provider.  Do not use street drugs.  Do not share needles.  Ask your health care provider for help if you need support or information about quitting drugs. Alcohol use  Do not drink alcohol if: ? Your health care provider tells you not to drink. ? You are pregnant, may be pregnant, or are planning to become pregnant.  If you drink alcohol: ? Limit how much you use to 0-1 drink a day. ? Limit intake if you are breastfeeding.  Be aware of how much alcohol is in your drink. In the [place] one drink equals one 12 oz bottle of beer (355 mL), one 5 oz glass of wine (148 mL), or one 1 oz glass of hard liquor (44 mL). General instructions  Schedule regular health, dental, and eye exams.  Stay current with your vaccines.  Tell your health care provider if: ? You often feel depressed. ? You have ever been abused or do not feel safe at home. Summary  Adopting a healthy lifestyle and getting preventive care are important in promoting health and wellness.  Follow your health care provider's instructions about healthy  diet, exercising, and getting tested or screened for diseases.  Follow your health care provider's instructions on monitoring your cholesterol and blood pressure. This information is not intended to replace advice given to you by your health care provider. Make sure you discuss any questions you have with your health care provider. Document Released: 06/12/2011 Document Revised: 11/20/2018 Document Reviewed: 11/20/2018 Elsevier Patient Education  2020 [employer] Inc[employer]  

## 2019-08-13 NOTE — Progress Notes (Signed)
Gynecology Annual Exam  PCP: Juluis Pitch, MD  Chief Complaint:  Chief Complaint  Patient presents with  . Gynecologic Exam    History of Present Illness: Patient is a 48 y.o. G0P0000 presents for annual exam. The patient has no gyn complaints today.    LMP: No LMP recorded. Patient has had an ablation.  Postcoital Bleeding: no Dysmenorrhea: not applicable  The patient is sexually active. She currently uses vasectomy for contraception. She denies dyspareunia. She admits mild hot flashes about 3 times per week.   There is a FH of breast cancer in her mat aunt. FH of melanoma in her mom and sister, and pancreatic cancer in her mom, who is now deceased. Pt is MyRisk neg. IBIS=16.2%. There is no FH of ovarian cancer. The patient does do self-breast exams.  The patient wears seatbelts: yes.   The patient has regular exercise: she walks and hikes regularly. She admits healthy diet, adequate hydration and adequate sleep.    Patient has had a colonoscopy within the last 5 years. Hx of polyps with 5 year recommended follow up. To be managed by her PCP.   The patient denies current symptoms of depression.  Her current dose of citalopram is effective.  Review of Systems: Review of Systems  Constitutional: Negative.   HENT: Negative.   Eyes: Negative.   Respiratory: Negative.   Cardiovascular: Negative.   Gastrointestinal: Negative.   Genitourinary: Negative.   Musculoskeletal: Negative.   Skin: Negative.   Neurological: Negative.   Endo/Heme/Allergies: Negative.   Psychiatric/Behavioral: Negative.     Past Medical History:  Past Medical History:  Diagnosis Date  . BRCA negative 03/2015   MyRisk neg  . Depression   . Family history of breast cancer    IBIS=16%  . Hot flashes, menopausal   . Insomnia   . Menorrhagia     Past Surgical History:  Past Surgical History:  Procedure Laterality Date  . ABLATION ON ENDOMETRIOSIS  2014  . COLONOSCOPY  10/2013   negative; repeat in 5 years  . DILATION AND CURETTAGE OF UTERUS    . ENDOMETRIAL ABLATION    . TONSILLECTOMY      Gynecologic History:  No LMP recorded. Patient has had an ablation. Contraception: vasectomy Last Pap: 3 years ago Results were:  no abnormalities  Last mammogram: 2 years ago Results were: BI-RAD I  Obstetric History: G0P0000  Family History:  Family History  Problem Relation Age of Onset  . Melanoma Mother   . Pancreatic cancer Mother 37  . Melanoma Sister   . Breast cancer Maternal Aunt 30    Social History:  Social History   Socioeconomic History  . Marital status: Married    Spouse name: Not on file  . Number of children: Not on file  . Years of education: Not on file  . Highest education level: Not on file  Occupational History  . Not on file  Social Needs  . Financial resource strain: Not on file  . Food insecurity    Worry: Not on file    Inability: Not on file  . Transportation needs    Medical: Not on file    Non-medical: Not on file  Tobacco Use  . Smoking status: Never Smoker  . Smokeless tobacco: Never Used  Substance and Sexual Activity  . Alcohol use: Yes    Alcohol/week: 1.0 standard drinks    Types: 1 Cans of beer per week  . Drug use: No  .  Sexual activity: Yes    Birth control/protection: Other-see comments    Comment: ablation  Lifestyle  . Physical activity    Days per week: 6 days    Minutes per session: 60 min  . Stress: Rather much  Relationships  . Social connections    Talks on phone: More than three times a week    Gets together: More than three times a week    Attends religious service: Never    Active member of club or organization: No    Attends meetings of clubs or organizations: Never    Relationship status: Married  . Intimate partner violence    Fear of current or ex partner: No    Emotionally abused: No    Physically abused: No    Forced sexual activity: No  Other Topics Concern  . Not on file   Social History Narrative  . Not on file    Allergies:  Allergies  Allergen Reactions  . Codeine Nausea And Vomiting    Also nausea.    Medications: Prior to Admission medications   Medication Sig Start Date End Date Taking? Authorizing Provider  Cholecalciferol (VITAMIN D3) 1000 units CAPS Take by mouth.   Yes [provider]  citalopram (CELEXA) 20 MG tablet Take 1/2 tablet PO daily 12/25/17  Yes [provider]  traZODone (DESYREL) 100 MG tablet Take 1.5 tablets po nightly PRN 12/25/17  Yes [provider]    Physical Exam Vitals: Blood pressure 118/70, height _0  (1.575 m), weight 136 lb (61.7 kg).  General: NAD HEENT: normocephalic, anicteric Thyroid: no enlargement, no palpable nodules Pulmonary: No increased work of breathing, CTAB Cardiovascular: RRR, distal pulses 2+ Breast: Breast symmetrical, no tenderness, no palpable nodules or masses, no skin or nipple retraction present, no nipple discharge.  No axillary or supraclavicular lymphadenopathy. Abdomen: NABS, soft, non-tender, non-distended.  Umbilicus without lesions.  No hepatomegaly, splenomegaly or masses palpable. No evidence of hernia  Genitourinary:  External: Normal external female genitalia.  Normal urethral meatus, normal Bartholin's and Skene's glands.    Vagina: Normal vaginal mucosa, no evidence of prolapse.    Cervix: Grossly normal in appearance, no bleeding, no CMT  Uterus: Non-enlarged, mobile, normal contour.    Adnexa: ovaries non-enlarged, no adnexal masses  Rectal: deferred  Lymphatic: no evidence of inguinal lymphadenopathy Extremities: no edema, erythema, or tenderness Neurologic: Grossly intact Psychiatric: mood appropriate, affect full   Assessment: 48 y.o. G0P0000 routine annual exam  Plan: Problem List Items Addressed This Visit    None    Visit Diagnoses    Well woman exam with routine gynecological exam    -  Primary   Relevant Orders   Cytology - PAP    MM DIGITAL SCREENING BILATERAL   Cervical cancer screening       Relevant Orders   Cytology - PAP   Screening for breast cancer       Relevant Orders   MM DIGITAL SCREENING BILATERAL      1) Mammogram - recommend yearly screening mammogram.  Mammogram Was ordered today  2) STI screening  was offered and declined  3) ASCCP guidelines and rationale discussed.  Patient opts for every 3 years screening interval  4) Contraception - the patient is currently using  vasectomy.  She is happy with her current form of contraception and plans to continue  5) Colonoscopy -- Screening recommended starting at age 64 for average risk individuals, age 76 for individuals deemed at increased risk (including  African Americans) and recommended to continue until age 76.  For patient age 47-85 individualized approach is recommended.  Gold standard screening is via colonoscopy, Cologuard screening is an acceptable alternative for patient unwilling or unable to undergo colonoscopy.  "Colorectal cancer screening for average?risk adults: 2018 guideline update from the [place]: A Cancer Journal for Clinicians: May 09, 2017.   6) Routine healthcare maintenance including cholesterol, diabetes screening discussed managed by PCP  7) Return in about 1 year (around 08/12/2020) for annual established gyn.   Rod Can, [facility] 08/13/2019, 8:48 AM

## 2019-08-15 LAB — CYTOLOGY - PAP
Diagnosis: NEGATIVE
HPV: NOT DETECTED

## 2020-04-20 NOTE — Progress Notes (Signed)
Office Visit Note  Patient: Lisa Mcbride             Date of Birth: 1971/07/18           MRN: [id number]             PCP: Juluis Pitch, MD Referring: Juluis Pitch, MD Visit Date: 04/27/2020 Occupation: '@GUAROCC'$ @  Subjective:  Other (finger pain/stiffness )   History of Present Illness: Lisa Mcbride is a 49 y.o. female with history of myofascial pain and plantar fasciitis.  She states she is continues to have recurrent problems with plantar fasciitis.  She states her plantar fascial is always painful.  She denies any discomfort in the SI joints.  She states for the last 6 months she has been experiencing increased pain in her hands.  She is having stiffness in the morning and also difficulty making a fist.  Also has some discomfort in her knee joints.  Activities of Daily Living:  Patient reports morning stiffness for 2-3 hours.   Patient Reports nocturnal pain.  Difficulty dressing/grooming: Denies Difficulty climbing stairs: Denies Difficulty getting out of chair: Denies Difficulty using hands for taps, buttons, cutlery, and/or writing: Denies  Review of Systems  Constitutional: Positive for fatigue. Negative for night sweats, weight gain and weight loss.  HENT: Negative for mouth sores, trouble swallowing, trouble swallowing, mouth dryness and nose dryness.   Eyes: Negative for pain, redness, itching, visual disturbance and dryness.  Respiratory: Negative for cough, shortness of breath and difficulty breathing.   Cardiovascular: Negative for chest pain, palpitations, hypertension, irregular heartbeat and swelling in legs/feet.  Gastrointestinal: Negative for blood in stool, constipation and diarrhea.  Endocrine: Negative for increased urination.  Genitourinary: Negative for difficulty urinating and vaginal dryness.  Musculoskeletal: Positive for arthralgias, joint pain, myalgias, morning stiffness, muscle tenderness and myalgias. Negative for joint swelling and  muscle weakness.  Skin: Negative for color change, rash, hair loss, redness, skin tightness, ulcers and sensitivity to sunlight.  Allergic/Immunologic: Negative for susceptible to infections.  Neurological: Negative for dizziness, numbness, headaches, memory loss, night sweats and weakness.  Hematological: Negative for bruising/bleeding tendency and swollen glands.  Psychiatric/Behavioral: Negative for depressed mood, confusion and sleep disturbance. The patient is not nervous/anxious.     PMFS History:  Patient Active Problem List   Diagnosis Date Noted  . Family history of pancreatic cancer 01/22/2018  . HLA B27 (HLA B27 positive) 03/06/2017  . Bilateral plantar fasciitis 03/06/2017  . Primary insomnia 03/06/2017  . History of depression 03/06/2017    Past Medical History:  Diagnosis Date  . BRCA negative 03/2015   MyRisk neg  . Depression   . Family history of breast cancer    IBIS=16%  . Hot flashes, menopausal   . Insomnia   . Menorrhagia     Family History  Problem Relation Age of Onset  . Melanoma Mother   . Pancreatic cancer Mother 20  . Melanoma Sister   . Breast cancer Maternal Aunt 30   Past Surgical History:  Procedure Laterality Date  . ABLATION ON ENDOMETRIOSIS  2014  . COLONOSCOPY  10/2013   negative; repeat in 5 years  . DILATION AND CURETTAGE OF UTERUS    . ENDOMETRIAL ABLATION    . TONSILLECTOMY     Social History   Social History Narrative  . Not on file    There is no immunization history on file for this patient.   Objective: Vital Signs: BP 135/78 (BP Location: Left Arm,  Patient Position: Sitting, Cuff Size: Normal)   Pulse 66   Resp 14   Ht '5\' 2"'$  (1.575 m)   Wt 138 lb 12.8 oz (63 kg)   BMI 25.39 kg/m    Physical Exam Vitals and nursing note reviewed.  Constitutional:      Appearance: She is well-developed.  HENT:     Head: Normocephalic and atraumatic.  Eyes:     Conjunctiva/sclera: Conjunctivae normal.  Cardiovascular:      Rate and Rhythm: Normal rate and regular rhythm.     Heart sounds: Normal heart sounds.  Pulmonary:     Effort: Pulmonary effort is normal.     Breath sounds: Normal breath sounds.  Abdominal:     General: Bowel sounds are normal.     Palpations: Abdomen is soft.  Musculoskeletal:     Cervical back: Normal range of motion.  Lymphadenopathy:     Cervical: No cervical adenopathy.  Skin:    General: Skin is warm and dry.     Capillary Refill: Capillary refill takes less than 2 seconds.  Neurological:     Mental Status: She is alert and oriented to person, place, and time.  Psychiatric:        Behavior: Behavior normal.      Musculoskeletal Exam: C-spine, thoracic and lumbar spine were in good range of motion.  She had no SI joint tenderness.  Shoulder joints, elbow joints, wrist joints were in good range of motion with no synovitis.  She has no synovitis over MCPs.  She had bilateral PIP and DIP tenderness with no synovitis.  Hip joints, knee joints, ankles, MTPs and PIPs with good range of motion.  She had no tenderness over plantar fascia or Achilles tendon.  CDAI Exam: CDAI Score: -- Patient Global: --; Provider Global: -- Swollen: --; Tender: -- Joint Exam 04/27/2020   No joint exam has been documented for this visit   There is currently no information documented on the homunculus. Go to the Rheumatology activity and complete the homunculus joint exam.  Investigation: No additional findings.  Imaging: No results found.  Recent Labs: Lab Results  Component Value Date   WBC 4.8 05/21/2017   HGB 12.3 05/21/2017   PLT 221 05/21/2017   NA 136 05/21/2017   K 4.8 05/21/2017   CL 102 05/21/2017   CO2 25 05/21/2017   GLUCOSE 83 05/21/2017   BUN 18 05/21/2017   CREATININE 0.87 05/21/2017   BILITOT 0.5 05/21/2017   ALKPHOS 79 05/21/2017   AST 17 05/21/2017   ALT 14 05/21/2017   PROT 7.0 05/21/2017   ALBUMIN 4.2 05/21/2017   CALCIUM 9.5 05/21/2017   GFRAA >89  05/21/2017    Speciality Comments: No specialty comments available.  Procedures:  No procedures performed Allergies: Codeine   Assessment / Plan:     Visit Diagnoses: Pain in both hands -patient complains of pain and discomfort in her bilateral hands and intermittent swelling.  No synovitis was noted.  She has DIP and PIP thickening bilaterally.  I will obtain following labs today.  Plan: Sedimentation rate, 14-3-3 eta Protein, Cyclic citrul peptide antibody, IgG, Rheumatoid factor, XR Hand 2 View Right, XR Hand 2 View Left.  X-ray of bilateral hands were consistent with mild osteoarthritis.  Bilateral plantar fasciitis-she complains of ongoing discomfort in the plantar fascia.  She has no tenderness on palpation today.  Medication monitoring encounter -she has been taking anti-inflammatories.  Plan: CBC with Differential/Platelet, COMPLETE METABOLIC PANEL WITH GFR  HLA  B27 positive  Bilateral ankle joint pain-she denies any recent discomfort.  Myofascial pain-she continues to have some generalized pain and discomfort.  Primary insomnia  History of depression  Orders: Orders Placed This Encounter  Procedures  . XR Hand 2 View Right  . XR Hand 2 View Left  . CBC with Differential/Platelet  . COMPLETE METABOLIC PANEL WITH GFR  . Sedimentation rate  . 14-3-3 eta Protein  . Cyclic citrul peptide antibody, IgG  . Rheumatoid factor   No orders of the defined types were placed in this encounter.   .  Follow-Up Instructions: Return in about 3 months (around 07/28/2020) for Osteoarthritis MFPS.   Bo Merino, MD  Note - This record has been created using Editor, commissioning.  Chart creation errors have been sought, but may not always  have been located. Such creation errors do not reflect on  the standard of medical care.

## 2020-04-27 ENCOUNTER — Ambulatory Visit (INDEPENDENT_AMBULATORY_CARE_PROVIDER_SITE_OTHER): Payer: 59 | Admitting: Rheumatology

## 2020-04-27 ENCOUNTER — Ambulatory Visit: Payer: Self-pay

## 2020-04-27 ENCOUNTER — Encounter: Payer: Self-pay | Admitting: Rheumatology

## 2020-04-27 ENCOUNTER — Other Ambulatory Visit: Payer: Self-pay

## 2020-04-27 VITALS — BP 135/78 | HR 66 | Resp 14 | Ht 62.0 in | Wt 138.8 lb

## 2020-04-27 DIAGNOSIS — Z1589 Genetic susceptibility to other disease: Secondary | ICD-10-CM | POA: Diagnosis not present

## 2020-04-27 DIAGNOSIS — M79642 Pain in left hand: Secondary | ICD-10-CM

## 2020-04-27 DIAGNOSIS — Z5181 Encounter for therapeutic drug level monitoring: Secondary | ICD-10-CM | POA: Diagnosis not present

## 2020-04-27 DIAGNOSIS — M79641 Pain in right hand: Secondary | ICD-10-CM

## 2020-04-27 DIAGNOSIS — M722 Plantar fascial fibromatosis: Secondary | ICD-10-CM

## 2020-04-27 DIAGNOSIS — F5101 Primary insomnia: Secondary | ICD-10-CM

## 2020-04-27 DIAGNOSIS — M25571 Pain in right ankle and joints of right foot: Secondary | ICD-10-CM

## 2020-04-27 DIAGNOSIS — M25572 Pain in left ankle and joints of left foot: Secondary | ICD-10-CM

## 2020-04-27 DIAGNOSIS — M7918 Myalgia, other site: Secondary | ICD-10-CM

## 2020-04-27 DIAGNOSIS — Z8659 Personal history of other mental and behavioral disorders: Secondary | ICD-10-CM

## 2020-04-28 ENCOUNTER — Other Ambulatory Visit: Payer: 59 | Admitting: Rheumatology

## 2020-05-02 LAB — COMPLETE METABOLIC PANEL WITH GFR
AG Ratio: 1.6 (calc) (ref 1.0–2.5)
ALT: 21 U/L (ref 6–29)
AST: 22 U/L (ref 10–35)
Albumin: 4.4 g/dL (ref 3.6–5.1)
Alkaline phosphatase (APISO): 88 U/L (ref 37–153)
BUN: 17 mg/dL (ref 7–25)
CO2: 29 mmol/L (ref 20–32)
Calcium: 9.7 mg/dL (ref 8.6–10.4)
Chloride: 101 mmol/L (ref 98–110)
Creat: 0.86 mg/dL (ref 0.50–1.05)
GFR, Est African American: 91 mL/min/{1.73_m2} (ref >=60)
GFR, Est Non African American: 79 mL/min/{1.73_m2} (ref >=60)
Globulin: 2.8 g/dL (calc) (ref 1.9–3.7)
Glucose, Bld: 69 mg/dL (ref 65–99)
Potassium: 4.5 mmol/L (ref 3.5–5.3)
Sodium: 138 mmol/L (ref 135–146)
Total Bilirubin: 0.3 mg/dL (ref 0.2–1.2)
Total Protein: 7.2 g/dL (ref 6.1–8.1)

## 2020-05-02 LAB — CBC WITH DIFFERENTIAL/PLATELET
Absolute Monocytes: 450 cells/uL (ref 200–950)
Basophils Absolute: 40 cells/uL (ref 0–200)
Basophils Relative: 0.8 %
Eosinophils Absolute: 210 cells/uL (ref 15–500)
Eosinophils Relative: 4.2 %
HCT: 39.2 % (ref 35.0–45.0)
Hemoglobin: 13.1 g/dL (ref 11.7–15.5)
Lymphs Abs: 1725 cells/uL (ref 850–3900)
MCH: 29.2 pg (ref 27.0–33.0)
MCHC: 33.4 g/dL (ref 32.0–36.0)
MCV: 87.3 fL (ref 80.0–100.0)
MPV: 8.7 fL (ref 7.5–12.5)
Monocytes Relative: 9 %
Neutro Abs: 2575 cells/uL (ref 1500–7800)
Neutrophils Relative %: 51.5 %
Platelets: 249 10*3/uL (ref 140–400)
RBC: 4.49 10*6/uL (ref 3.80–5.10)
RDW: 12.4 % (ref 11.0–15.0)
Total Lymphocyte: 34.5 %
WBC: 5 10*3/uL (ref 3.8–10.8)

## 2020-05-02 LAB — CYCLIC CITRUL PEPTIDE ANTIBODY, IGG: Cyclic Citrullin Peptide Ab: <16 UNITS

## 2020-05-02 LAB — 14-3-3 ETA PROTEIN: 14-3-3 eta Protein: <0.2 ng/mL (ref <0.2)

## 2020-05-02 LAB — RHEUMATOID FACTOR: Rhuematoid fact SerPl-aCnc: <14 IU/mL (ref <14)

## 2020-05-02 LAB — SEDIMENTATION RATE: Sed Rate: 22 mm/h — ABNORMAL HIGH (ref 0–20)

## 2020-05-02 NOTE — Progress Notes (Signed)
All the labs are normal. ESR is mildly elevsted. Pt. is sch for [place].

## 2020-05-19 ENCOUNTER — Ambulatory Visit: Payer: Self-pay

## 2020-05-19 ENCOUNTER — Ambulatory Visit (INDEPENDENT_AMBULATORY_CARE_PROVIDER_SITE_OTHER): Payer: 59 | Admitting: Rheumatology

## 2020-05-19 ENCOUNTER — Other Ambulatory Visit: Payer: Self-pay

## 2020-05-19 DIAGNOSIS — M79642 Pain in left hand: Secondary | ICD-10-CM

## 2020-05-19 DIAGNOSIS — M79641 Pain in right hand: Secondary | ICD-10-CM | POA: Diagnosis not present

## 2020-07-16 NOTE — Progress Notes (Signed)
 Office Visit Note  Patient: Lisa Mcbride             Date of Birth: 04/25/1971           MRN: [id number]             PCP: Bronstein, David, MD Referring: Bronstein, David, MD Visit Date: 07/28/2020 Occupation: @GUAROCC@  Subjective:  Other (joint pain in low back and bilateral shoulders, elbows, feet and ankles. )   History of Present Illness: Lisa Mcbride is a 49 y.o. female with history of osteoarthritis and myofascial pain syndrome.  She states she continues to have some discomfort in her neck shoulders elbows feet and ankles.  She has not noticed any joint swelling.  She continues to have stiffness and discomfort.  Activities of Daily Living:  Patient reports morning stiffness for 1-2  hours.   Patient Reports nocturnal pain.  Difficulty dressing/grooming: Denies Difficulty climbing stairs: Denies Difficulty getting out of chair: Reports Difficulty using hands for taps, buttons, cutlery, and/or writing: Reports  Review of Systems  Constitutional: Positive for fatigue.  HENT: Negative for mouth sores, mouth dryness and nose dryness.   Eyes: Negative for itching and dryness.  Respiratory: Negative for shortness of breath and difficulty breathing.   Cardiovascular: Negative for chest pain and palpitations.  Gastrointestinal: Negative for blood in stool, constipation and diarrhea.  Endocrine: Negative for increased urination.  Genitourinary: Negative for difficulty urinating.  Musculoskeletal: Positive for arthralgias, joint pain and morning stiffness. Negative for joint swelling, myalgias, muscle tenderness and myalgias.  Skin: Negative for color change, rash and redness.  Allergic/Immunologic: Negative for susceptible to infections.  Neurological: Positive for dizziness. Negative for numbness, headaches, memory loss and weakness.  Hematological: Negative for bruising/bleeding tendency.  Psychiatric/Behavioral: Negative for confusion.    PMFS History:  Patient  Active Problem List   Diagnosis Date Noted  . Dyslipidemia 07/28/2020  . Family history of pancreatic cancer 01/22/2018  . HLA B27 (HLA B27 positive) 03/06/2017  . Bilateral plantar fasciitis 03/06/2017  . Primary insomnia 03/06/2017  . History of depression 03/06/2017    Past Medical History:  Diagnosis Date  . BRCA negative 03/2015   MyRisk neg  . Depression   . Family history of breast cancer    IBIS=16%  . Hot flashes, menopausal   . Insomnia   . Menorrhagia     Family History  Problem Relation Age of Onset  . Melanoma Mother   . Pancreatic cancer Mother 74  . Melanoma Sister   . Breast cancer Maternal Aunt 30   Past Surgical History:  Procedure Laterality Date  . ABLATION ON ENDOMETRIOSIS  2014  . COLONOSCOPY  10/2013   negative; repeat in 5 years  . DILATION AND CURETTAGE OF UTERUS    . ENDOMETRIAL ABLATION    . TONSILLECTOMY     Social History   Social History Narrative  . Not on file   Immunization History  Administered Date(s) Administered  . PFIZER SARS-COV-2 Vaccination 03/04/2020, 03/27/2020     Objective: Vital Signs: BP 107/78 (BP Location: Left Arm, Patient Position: Sitting, Cuff Size: Normal)   Pulse 68   Resp 15   Ht 5' 3" (1.6 m)   Wt 137 lb 12.8 oz (62.5 kg)   BMI 24.41 kg/m    Physical Exam Vitals and nursing note reviewed.  Constitutional:      Appearance: She is well-developed.  HENT:     Head: Normocephalic and atraumatic.  Eyes:       Conjunctiva/sclera: Conjunctivae normal.  Cardiovascular:     Rate and Rhythm: Normal rate and regular rhythm.     Heart sounds: Normal heart sounds.  Pulmonary:     Effort: Pulmonary effort is normal.     Breath sounds: Normal breath sounds.  Abdominal:     General: Bowel sounds are normal.     Palpations: Abdomen is soft.  Musculoskeletal:     Cervical back: Normal range of motion.  Lymphadenopathy:     Cervical: No cervical adenopathy.  Skin:    General: Skin is warm and dry.      Capillary Refill: Capillary refill takes less than 2 seconds.  Neurological:     Mental Status: She is alert and oriented to person, place, and time.  Psychiatric:        Behavior: Behavior normal.      Musculoskeletal Exam: C-spine, thoracic and lumbar spine with good range of motion.  Shoulder joints, elbow joints, wrist joints, MCPs PIPs and DIPs with good range of motion with no synovitis.  Hip joints, knee joints, ankles, MTPs and PIPs with good range of motion with no synovitis.  She has DIP and PIP thickening in her hands.  CDAI Exam: CDAI Score: -- Patient Global: --; Provider Global: -- Swollen: --; Tender: -- Joint Exam 07/28/2020   No joint exam has been documented for this visit   There is currently no information documented on the homunculus. Go to the Rheumatology activity and complete the homunculus joint exam.  Investigation: No additional findings.  Imaging: No results found.  Recent Labs: Lab Results  Component Value Date   WBC 5.0 04/27/2020   HGB 13.1 04/27/2020   PLT 249 04/27/2020   NA 138 04/27/2020   K 4.5 04/27/2020   CL 101 04/27/2020   CO2 29 04/27/2020   GLUCOSE 69 04/27/2020   BUN 17 04/27/2020   CREATININE 0.86 04/27/2020   BILITOT 0.3 04/27/2020   ALKPHOS 79 05/21/2017   AST 22 04/27/2020   ALT 21 04/27/2020   PROT 7.2 04/27/2020   ALBUMIN 4.2 05/21/2017   CALCIUM 9.7 04/27/2020   GFRAA 91 04/27/2020  July 19, 2020 CMP normal, LDL 147, CBC normal  Speciality Comments: No specialty comments available.  Procedures:  No procedures performed Allergies: Codeine   Assessment / Plan:     Visit Diagnoses: Primary osteoarthritis of both hands - X-ray of bilateral hands were consistent with mild osteoarthritis.  She continues to have some pain and stiffness in her hands.  She has DIP and PIP thickening with no synovitis.  Natural anti-inflammatories were discussed.  Bilateral plantar fasciitis-she has not had any recent episodes of  plantar fasciitis.  She had no tenderness on palpation.  HLA B27 positive-she has no clinical features of a spondyloarthropathy.  Myofascial pain-she continues to have generalized pain and discomfort.  She also suffers from insomnia.  Good sleep hygiene was discussed.  Water aerobics and stretching exercises were emphasized.  Bilateral ankle joint pain-no synovitis was noted.  Primary insomnia-she is on trazodone.  Dyslipidemia-her LDL is elevated.  We had detailed discussion regarding dietary modifications.  I have also advised her to schedule an appointment with her PCP.  History of depression   Patient is fully immunized against COVID-19.  Use of mask, social distancing and hand hygiene was discussed.  Orders: No orders of the defined types were placed in this encounter.  No orders of the defined types were placed in this encounter.     Follow-Up Instructions:   Return in about 1 year (around 07/28/2021) for Osteoarthritis, [facility]PS.   Bo Merino, MD  Note - This record has been created using Editor, commissioning.  Chart creation errors have been sought, but may not always  have been located. Such creation errors do not reflect on  the standard of medical care.

## 2020-07-28 ENCOUNTER — Encounter: Payer: Self-pay | Admitting: Rheumatology

## 2020-07-28 ENCOUNTER — Other Ambulatory Visit: Payer: Self-pay

## 2020-07-28 ENCOUNTER — Ambulatory Visit (INDEPENDENT_AMBULATORY_CARE_PROVIDER_SITE_OTHER): Payer: 59 | Admitting: Rheumatology

## 2020-07-28 VITALS — BP 107/78 | HR 68 | Resp 15 | Ht 63.0 in | Wt 137.8 lb

## 2020-07-28 DIAGNOSIS — Z1589 Genetic susceptibility to other disease: Secondary | ICD-10-CM | POA: Diagnosis not present

## 2020-07-28 DIAGNOSIS — M722 Plantar fascial fibromatosis: Secondary | ICD-10-CM | POA: Diagnosis not present

## 2020-07-28 DIAGNOSIS — M7918 Myalgia, other site: Secondary | ICD-10-CM

## 2020-07-28 DIAGNOSIS — M25572 Pain in left ankle and joints of left foot: Secondary | ICD-10-CM

## 2020-07-28 DIAGNOSIS — M25571 Pain in right ankle and joints of right foot: Secondary | ICD-10-CM | POA: Diagnosis not present

## 2020-07-28 DIAGNOSIS — Z8659 Personal history of other mental and behavioral disorders: Secondary | ICD-10-CM

## 2020-07-28 DIAGNOSIS — M19042 Primary osteoarthritis, left hand: Secondary | ICD-10-CM

## 2020-07-28 DIAGNOSIS — E785 Hyperlipidemia, unspecified: Secondary | ICD-10-CM

## 2020-07-28 DIAGNOSIS — M19041 Primary osteoarthritis, right hand: Secondary | ICD-10-CM

## 2020-07-28 DIAGNOSIS — F5101 Primary insomnia: Secondary | ICD-10-CM

## 2020-08-31 ENCOUNTER — Ambulatory Visit (INDEPENDENT_AMBULATORY_CARE_PROVIDER_SITE_OTHER): Payer: 59 | Admitting: Sports Medicine

## 2020-08-31 ENCOUNTER — Other Ambulatory Visit: Payer: Self-pay

## 2020-08-31 ENCOUNTER — Encounter: Payer: Self-pay | Admitting: Sports Medicine

## 2020-08-31 ENCOUNTER — Ambulatory Visit (INDEPENDENT_AMBULATORY_CARE_PROVIDER_SITE_OTHER): Payer: 59

## 2020-08-31 DIAGNOSIS — S99922A Unspecified injury of left foot, initial encounter: Secondary | ICD-10-CM | POA: Diagnosis not present

## 2020-08-31 DIAGNOSIS — M79672 Pain in left foot: Secondary | ICD-10-CM | POA: Diagnosis not present

## 2020-08-31 DIAGNOSIS — M7662 Achilles tendinitis, left leg: Secondary | ICD-10-CM | POA: Diagnosis not present

## 2020-08-31 DIAGNOSIS — M722 Plantar fascial fibromatosis: Secondary | ICD-10-CM | POA: Diagnosis not present

## 2020-08-31 MED ORDER — PREDNISONE 10 MG (21) PO TBPK
ORAL_TABLET | ORAL | 0 refills | Status: DC
Start: 2020-08-31 — End: 2022-04-11

## 2020-08-31 NOTE — Progress Notes (Signed)
Patient ID: Lisa Mcbride, female   DOB: 1971-07-09, 49 y.o.   MRN: [id number] Subjective: Lisa Mcbride is a 49 y.o. female patient presents to office for left heel pain reports that she was doing yard work and took a Loss adjuster, chartered on her left foot and now has pain at the back of her Achilles and reports that she is going to the beach on Friday and became very concerned and wanted to have her foot checked.  Patient reports that the injury happened yesterday but today she pulled out her cam boot that seems to be helping.  Patient denies any current pain on the right admits all pain on the left.  Admits to some swelling and reports that she has not been really taking any medications for inflammation and was treated by rheumatologist who was just monitoring her symptoms and not really giving her any medications at this time for inflammation.  Patient denies any other pedal complaints at this time.  Patient Active Problem List   Diagnosis Date Noted  . Dyslipidemia 07/28/2020  . Family history of pancreatic cancer 01/22/2018  . HLA B27 (HLA B27 positive) 03/06/2017  . Bilateral plantar fasciitis 03/06/2017  . Primary insomnia 03/06/2017  . History of depression 03/06/2017   Current Outpatient Medications on File Prior to Visit  Medication Sig Dispense Refill  . Cholecalciferol (VITAMIN D3) 1000 units CAPS Take by mouth.    . citalopram (CELEXA) 20 MG tablet Take 1/2 tablet PO daily    . traZODone (DESYREL) 100 MG tablet Take 1.5 tablets po nightly PRN     No current facility-administered medications on file prior to visit.   Allergies  Allergen Reactions  . Codeine Nausea And Vomiting    Also nausea.     Objective: Physical Exam General: The patient is alert and oriented x3 in no acute distress.  Dermatology: Skin is warm, dry and supple bilateral lower extremities. Nails 1-10 are normal. There is no erythema, edema, no eccymosis, no open lesions present. Integument is otherwise  unremarkable.  Vascular: Dorsalis Pedis pulse and Posterior Tibial pulse are 2/4 bilateral. Capillary fill time is immediate to all digits.    Neurological: Grossly intact to light touch with an achilles reflex of +2/5 and a negative Tinel's sign bilateral.  Musculoskeletal: There is tenderness to palpation at left Achilles tendon insertion with mild soft tissue swelling tendon feels intact negative Thompson test.  Limited range of motion on left due to pain.  X-rays normal osseous mineralization reveals no acute pathology no fracture no dislocation chronic heel spurs soft tissue margins within normal limits   Assessment and Plan: Problem List Items Addressed This Visit    None    Visit Diagnoses    Achilles tendinitis, left leg    -  Primary   Relevant Orders   DG Foot Complete Left   Plantar fasciitis of left foot       Pain of left heel         -Complete examination performed -X-rays reviewed -Discussed care for the flareup of tendinitis on the left -Prescribed prednisone Dosepak for patient to take as instructed -Advised patient to take Tylenol for any additional breakthrough pain -Recommend rest ice elevation and use of Cam boot strictly and if feels good on Friday may slowly wean to tennis shoe with heel lifts as provided -Return if fails to continue to improve or if symptoms worsen.  Landis Martins, DPM

## 2020-09-01 ENCOUNTER — Other Ambulatory Visit: Payer: Self-pay | Admitting: Sports Medicine

## 2020-09-01 DIAGNOSIS — M79672 Pain in left foot: Secondary | ICD-10-CM

## 2020-09-01 DIAGNOSIS — S99922A Unspecified injury of left foot, initial encounter: Secondary | ICD-10-CM

## 2020-09-22 ENCOUNTER — Other Ambulatory Visit: Payer: Self-pay

## 2020-09-22 ENCOUNTER — Other Ambulatory Visit: Payer: 59

## 2020-09-22 DIAGNOSIS — Z20822 Contact with and (suspected) exposure to covid-19: Secondary | ICD-10-CM

## 2020-09-23 LAB — SARS-COV-2, NAA 2 DAY TAT

## 2020-09-23 LAB — NOVEL CORONAVIRUS, NAA: SARS-CoV-2, NAA: NOT DETECTED

## 2021-11-24 ENCOUNTER — Encounter: Payer: Self-pay | Admitting: Emergency Medicine

## 2021-11-24 ENCOUNTER — Ambulatory Visit
Admission: EM | Admit: 2021-11-24 | Discharge: 2021-11-24 | Disposition: A | Discharge status: Home / Self Care | Payer: 59 | Attending: Family Medicine | Admitting: Family Medicine

## 2021-11-24 DIAGNOSIS — B349 Viral infection, unspecified: Secondary | ICD-10-CM | POA: Diagnosis not present

## 2021-11-24 DIAGNOSIS — Z1152 Encounter for screening for COVID-19: Secondary | ICD-10-CM | POA: Diagnosis not present

## 2021-11-24 LAB — POCT INFLUENZA A/B
Influenza A, POC: NEGATIVE
Influenza B, POC: NEGATIVE

## 2021-11-24 MED ORDER — PROMETHAZINE-DM 6.25-15 MG/5ML PO SYRP: 5.0000 mL | ORAL_SOLUTION | Freq: Four times a day (QID) | ORAL | 0 refills | Status: DC | PRN

## 2021-11-24 NOTE — ED Provider Notes (Signed)
[facility] Lisa Mcbride    CSN: [id number] Arrival date & time: 11/24/21  1714      History   Chief Complaint Chief Complaint  Patient presents with   Fever   Cough   Generalized Body Aches    HPI Lisa Mcbride is a 50 y.o. female.   HPI Patient presents today with symptoms of cough, generalized body aches, sore throat x1 day.   She is concerned that she may have influenza. She is febrile on arrival with all other vital signs stable.  She has been managing symptoms Nyquil. No known sick contacts that have been positive for COVID or Flu.  Past Medical History:  Diagnosis Date   BRCA negative 03/2015   MyRisk neg   Depression    Family history of breast cancer    IBIS=16%   Hot flashes, menopausal    Insomnia    Menorrhagia     Patient Active Problem List   Diagnosis Date Noted   Dyslipidemia 07/28/2020   Family history of pancreatic cancer 01/22/2018   HLA B27 (HLA B27 positive) 03/06/2017   Bilateral plantar fasciitis 03/06/2017   Primary insomnia 03/06/2017   History of depression 03/06/2017    Past Surgical History:  Procedure Laterality Date   ABLATION ON ENDOMETRIOSIS  2014   COLONOSCOPY  10/2013   negative; repeat in 5 years   DILATION AND CURETTAGE OF UTERUS     ENDOMETRIAL ABLATION     TONSILLECTOMY      OB History     Gravida  0   Para  0   Term  0   Preterm  0   AB  0   Living  0      SAB  0   IAB  0   Ectopic  0   Multiple  0   Live Births  0            Home Medications    Prior to Admission medications   Medication Sig Start Date End Date Taking? Authorizing Provider  citalopram (CELEXA) 20 MG tablet Take 1/2 tablet PO daily 12/25/17  Yes [provider]  promethazine-dextromethorphan (PROMETHAZINE-DM) 6.25-15 MG/5ML syrup Take 5 mLs by mouth 4 (four) times daily as needed for cough. 11/24/21  Yes Scot Jun, FNP  traZODone (DESYREL) 100 MG tablet Take 1.5 tablets po nightly PRN 12/25/17  Yes  [provider]  Cholecalciferol (VITAMIN D3) 1000 units CAPS Take by mouth.    [provider]  predniSONE (STERAPRED UNI-PAK 21 TAB) 10 MG (21) TBPK tablet Take as directed 08/31/20   Landis Martins, DPM    Family History Family History  Problem Relation Age of Onset   Melanoma Mother    Pancreatic cancer Mother 25   Melanoma Sister    Breast cancer Maternal Aunt 30    Social History Social History   Tobacco Use   Smoking status: Never   Smokeless tobacco: Never  Vaping Use   Vaping Use: Never used  Substance Use Topics   Alcohol use: Yes    Alcohol/week: 1.0 standard drink    Types: 1 Cans of beer per week    Comment: occ   Drug use: No     Allergies   Codeine   Review of Systems Review of Systems Pertinent negatives listed in HPI  Physical Exam Triage Vital Signs ED Triage Vitals  Enc Vitals Group     BP      Pulse  Resp      Temp      Temp src      SpO2      Weight      Height      Head Circumference      Peak Flow      Pain Score      Pain Loc      Pain Edu?      Excl. in [place]?    No data found.  Updated Vital Signs BP 132/84 (BP Location: Left Arm)    Pulse 99    Temp (!) 101.4 F (38.6 C) (Oral)    Resp 16    SpO2 96%   Visual Acuity Right Eye Distance:   Left Eye Distance:   Bilateral Distance:    Right Eye Near:   Left Eye Near:    Bilateral Near:     Physical Exam  General Appearance:    Alert, cooperative, no distress  HENT:   Normocephalic, ears normal, nares mucosal edema with congestion, rhinorrhea, oropharynx without erythema or exudate  Eyes:    PERRL, conjunctiva/corneas clear, EOM's intact       Lungs:     Clear to auscultation bilaterally, respirations unlabored  Heart:    Regular rate and rhythm  Neurologic:   Awake, alert, oriented x 3. No apparent focal neurological           defect.     UC Treatments / Results  Labs (all labs ordered are listed, but only abnormal results are  displayed) Labs Reviewed  COVID-19, FLU A+B NAA  POCT INFLUENZA A/B    EKG   Radiology No results found.  Procedures Procedures (including critical care time)  Medications Ordered in UC Medications - No data to display  Initial Impression / Assessment and Plan / UC Course  I have reviewed the triage vital signs and the nursing notes.  Pertinent labs & imaging results that were available during my care of the patient were reviewed by me and considered in my medical decision making (see chart for details).    Viral illness, COVID/Flu test pending.  Promethazine DM for cough in congestion.  Symptom management warranted only.  Manage fever with Tylenol and ibuprofen.  Nasal symptoms with over-the-counter antihistamines recommended.  Treatment per discharge medications/discharge instructions.  Red flags/ER precautions given. The most current CDC isolation/quarantine recommendation advised.   Final Clinical Impressions(s) / UC Diagnoses   Final diagnoses:  Encounter for screening for COVID-19  Viral illness     Discharge Instructions      Your COVID/Flu results should result within 3 days. Negative results are immediately resulted to Mychart. Positive results will receive a follow-up call from our clinic. If symptoms are present, I recommend home quarantine until results are known.  Alternate Tylenol and ibuprofen as needed for body aches and fever.  Symptom management per recommendations discussed today.  If any breathing difficulty or chest pain develops go immediately to the closest emergency department for evaluation.      ED Prescriptions     Medication Sig Dispense Auth. Provider   promethazine-dextromethorphan (PROMETHAZINE-DM) 6.25-15 MG/5ML syrup Take 5 mLs by mouth 4 (four) times daily as needed for cough. 180 mL Scot Jun, FNP      PDMP not reviewed this encounter.   Scot Jun, FNP 11/24/21 [phone]

## 2021-11-24 NOTE — ED Triage Notes (Addendum)
Pt c/o bodyaches,sinus pressure, ST, cough, and fever symptoms started yesterday. At home Covid test was negative.

## 2021-11-24 NOTE — Discharge Instructions (Addendum)
Your COVID/Flu results should result within 3 days. Negative results are immediately resulted to Mychart. Positive results will receive a follow-up call from our clinic. If symptoms are present, I recommend home quarantine until results are known.  Alternate Tylenol and ibuprofen as needed for body aches and fever.  Symptom management per recommendations discussed today.  If any breathing difficulty or chest pain develops go immediately to the closest emergency department for evaluation.

## 2021-11-25 LAB — COVID-19, FLU A+B NAA
Influenza A, NAA: NOT DETECTED
Influenza B, NAA: NOT DETECTED
SARS-CoV-2, NAA: DETECTED — AB

## 2022-04-11 ENCOUNTER — Encounter: Payer: Self-pay | Admitting: Obstetrics and Gynecology

## 2022-04-11 ENCOUNTER — Ambulatory Visit (INDEPENDENT_AMBULATORY_CARE_PROVIDER_SITE_OTHER): Payer: 59 | Admitting: Obstetrics and Gynecology

## 2022-04-11 VITALS — BP 102/70 | Ht 62.0 in | Wt 151.0 lb

## 2022-04-11 DIAGNOSIS — Z1231 Encounter for screening mammogram for malignant neoplasm of breast: Secondary | ICD-10-CM | POA: Diagnosis not present

## 2022-04-11 DIAGNOSIS — N941 Unspecified dyspareunia: Secondary | ICD-10-CM

## 2022-04-11 DIAGNOSIS — Z01419 Encounter for gynecological examination (general) (routine) without abnormal findings: Secondary | ICD-10-CM

## 2022-04-11 DIAGNOSIS — Z800 Family history of malignant neoplasm of digestive organs: Secondary | ICD-10-CM

## 2022-04-11 NOTE — Progress Notes (Signed)
? ?PCP:  Bronstein, David, MD ? ? ?Chief Complaint  ?Patient presents with  ? Gynecologic Exam  ?  No concerns  ? ? ? ?HPI: ?     [name] is a 51 y.o. G0P0000 who LMP was No LMP recorded. Patient has had an ablation., presents today for her  annual examination.  Her menses are absent due to endometrial ablation. Dysmenorrhea none. She does not have intermenstrual bleeding. Rare vasomotor sx.  ? ?Sex activity: single partner, contraception - vasectomy. She has vaginal dryness/burning during sex; sx inside vaginally not external. No vag sx otherwise. Has tried lubricants and coconut oil without relief. Even tried vag estrace crm in past without sx change.  ? ?Last Pap: 08/13/19 Results were: no abnormalities /neg HPV DNA. No hx of abn paps.  ?Hx of STDs: none ? ?Last mammogram: 03/07/22 at [facility], done through PCP.  Results were: normal--routine follow-up in 12 months ?There is a FH of breast cancer in her mat aunt. FH of melanoma in her mom and sister, and pancreatic cancer in her mom, who is now deceased. Pt is MyRisk neg. IBIS=16.2%. There is no FH of ovarian cancer. The patient does do self-breast exams. ? ?Tobacco use: The patient denies current or previous tobacco use. ?Alcohol use: social drinker ?No drug use.  ?Exercise: moderately active ? ?Colonoscopy: 2022, normal; repeat due after 10 yrs per pt ? ?She does get adequate calcium and Vitamin D in her diet. ? ?Labs with PCP.  ? ?Past Medical History:  ?Diagnosis Date  ? BRCA negative 03/2015  ? MyRisk neg  ? Depression   ? Family history of breast cancer   ? IBIS=16%  ? Hot flashes, menopausal   ? Insomnia   ? Menorrhagia   ? ? ?Past Surgical History:  ?Procedure Laterality Date  ? ABLATION ON ENDOMETRIOSIS  2014  ? COLONOSCOPY  10/2013  ? negative; repeat in 5 years  ? DILATION AND CURETTAGE OF UTERUS    ? ENDOMETRIAL ABLATION    ? TONSILLECTOMY    ? ? ?Family History  ?Problem Relation Age of Onset  ? Melanoma Mother   ? Pancreatic cancer Mother 74   ? Melanoma Sister   ? Breast cancer Maternal Aunt 30  ? ? ?Social History  ? ?Socioeconomic History  ? Marital status: Married  ?  Spouse name: Not on file  ? Number of children: Not on file  ? Years of education: Not on file  ? Highest education level: Not on file  ?Occupational History  ? Not on file  ?Tobacco Use  ? Smoking status: Never  ? Smokeless tobacco: Never  ?Vaping Use  ? Vaping Use: Never used  ?Substance and Sexual Activity  ? Alcohol use: Yes  ?  Alcohol/week: 1.0 standard drink  ?  Types: 1 Cans of beer per week  ?  Comment: occ  ? Drug use: No  ? Sexual activity: Yes  ?  Birth control/protection: Other-see comments  ?  Comment: ablation  ?Other Topics Concern  ? Not on file  ?Social History Narrative  ? Not on file  ? ?Social Determinants of Health  ? ?Financial Resource Strain: Not on file  ?Food Insecurity: Not on file  ?Transportation Needs: Not on file  ?Physical Activity: Not on file  ?Stress: Not on file  ?Social Connections: Not on file  ?Intimate Partner Violence: Not on file  ? ? ?Current Meds  ?Medication Sig  ? Cholecalciferol (VITAMIN D3) 1000 units   CAPS Take by mouth.  ? citalopram (CELEXA) 20 MG tablet Take 1/2 tablet PO daily  ? traZODone (DESYREL) 100 MG tablet Take 1.5 tablets po nightly PRN  ? ? ? ?ROS: ? ?Review of Systems  ?Constitutional:  Negative for fatigue, fever and unexpected weight change.  ?Respiratory:  Negative for cough, shortness of breath and wheezing.   ?Cardiovascular:  Negative for chest pain, palpitations and leg swelling.  ?Gastrointestinal:  Negative for blood in stool, constipation, diarrhea, nausea and vomiting.  ?Endocrine: Negative for cold intolerance, heat intolerance and polyuria.  ?Genitourinary:  Positive for dyspareunia. Negative for dysuria, flank pain, frequency, genital sores, hematuria, menstrual problem, pelvic pain, urgency, vaginal bleeding, vaginal discharge and vaginal pain.  ?Musculoskeletal:  Positive for arthralgias. Negative for back  pain, joint swelling and myalgias.  ?Skin:  Negative for rash.  ?Neurological:  Negative for dizziness, syncope, light-headedness, numbness and headaches.  ?Hematological:  Negative for adenopathy.  ?Psychiatric/Behavioral:  Positive for agitation and dysphoric mood. Negative for confusion, sleep disturbance and suicidal ideas. The patient is not nervous/anxious.   ? ?Objective: ?BP 102/70   Ht 5' 2" (1.575 m)   Wt 151 lb (68.5 kg)   BMI 27.62 kg/m?  ? ? ?Physical Exam ?Constitutional:   ?   Appearance: She is well-developed.  ?Genitourinary:  ?   Vulva normal.  ?   Genitourinary Comments: WELL ESTROGENIZED VULVA  ?   Right Labia: No rash, tenderness or lesions. ?   Left Labia: No tenderness, lesions or rash. ?   No vaginal discharge, erythema or tenderness.  ? ?   Right Adnexa: not tender and no mass present. ?   Left Adnexa: not tender and no mass present. ?   No cervical motion tenderness, friability or polyp.  ?   Uterus is not enlarged or tender.  ?Breasts: ?   Right: No mass, nipple discharge, skin change or tenderness.  ?   Left: No mass, nipple discharge, skin change or tenderness.  ?Neck:  ?   Thyroid: No thyromegaly.  ?Cardiovascular:  ?   Rate and Rhythm: Normal rate and regular rhythm.  ?   Heart sounds: Normal heart sounds. No murmur heard. ?Pulmonary:  ?   Effort: Pulmonary effort is normal.  ?   Breath sounds: Normal breath sounds.  ?Abdominal:  ?   Palpations: Abdomen is soft.  ?   Tenderness: There is no abdominal tenderness. There is no guarding or rebound.  ?Musculoskeletal:     ?   General: Normal range of motion.  ?   Cervical back: Normal range of motion.  ?Lymphadenopathy:  ?   Cervical: No cervical adenopathy.  ?Neurological:  ?   General: No focal deficit present.  ?   Mental Status: She is alert and oriented to person, place, and time.  ?   Cranial Nerves: No cranial nerve deficit.  ?Skin: ?   General: Skin is warm and dry.  ?Psychiatric:     ?   Mood and Affect: Mood normal.     ?    Behavior: Behavior normal.     ?   Thought Content: Thought content normal.     ?   Judgment: Judgment normal.  ?Vitals reviewed.  ? ?Assessment/Plan: ?Encounter for annual routine gynecological examination ? ?Encounter for screening mammogram for malignant neoplasm of breast; pt current on mammo ? ?Family history of pancreatic cancer--pt is MyRisk neg. No extra screening recommendations currently.  ? ?Dyspareunia in female--try hyaluronic acid vag supp with lubricants.  F/u prn.  ?          ?GYN counsel breast self exam, mammography screening, adequate intake of calcium and vitamin D, diet and exercise ? ? ?  F/U ? Return in about 1 year (around 04/12/2023). ? ? B. , PA-C ?04/11/2022 ?1:58 PM ?

## 2022-04-11 NOTE — Patient Instructions (Signed)
I value your feedback and you entrusting us with your care. If you get a [facility] patient survey, I would appreciate you taking the time to let us know about your experience today. Thank you! ? ? ?
